# Patient Record
Sex: Male | Born: 1955
Health system: Southern US, Community
[De-identification: ages and names within clinical notes are randomized; demographics above are authoritative.]

## PROBLEM LIST (undated history)

## (undated) DIAGNOSIS — J309 Allergic rhinitis, unspecified: Secondary | ICD-10-CM

## (undated) DIAGNOSIS — M1990 Unspecified osteoarthritis, unspecified site: Secondary | ICD-10-CM

## (undated) DIAGNOSIS — E119 Type 2 diabetes mellitus without complications: Secondary | ICD-10-CM

## (undated) DIAGNOSIS — K219 Gastro-esophageal reflux disease without esophagitis: Secondary | ICD-10-CM

## (undated) DIAGNOSIS — G4730 Sleep apnea, unspecified: Secondary | ICD-10-CM

## (undated) DIAGNOSIS — I10 Essential (primary) hypertension: Secondary | ICD-10-CM

## (undated) DIAGNOSIS — F32A Depression, unspecified: Secondary | ICD-10-CM

## (undated) DIAGNOSIS — K649 Unspecified hemorrhoids: Secondary | ICD-10-CM

## (undated) DIAGNOSIS — E785 Hyperlipidemia, unspecified: Secondary | ICD-10-CM

## (undated) DIAGNOSIS — G4733 Obstructive sleep apnea (adult) (pediatric): Secondary | ICD-10-CM

## (undated) DIAGNOSIS — F329 Major depressive disorder, single episode, unspecified: Secondary | ICD-10-CM

## (undated) DIAGNOSIS — N200 Calculus of kidney: Secondary | ICD-10-CM

## (undated) DIAGNOSIS — T7840XA Allergy, unspecified, initial encounter: Secondary | ICD-10-CM

## (undated) HISTORY — DX: Allergic rhinitis, unspecified: J30.9

## (undated) HISTORY — DX: Unspecified hemorrhoids: K64.9

## (undated) HISTORY — DX: Obstructive sleep apnea (adult) (pediatric): G47.33

## (undated) HISTORY — DX: Calculus of kidney: N20.0

## (undated) HISTORY — DX: Allergy, unspecified, initial encounter: T78.40XA

## (undated) HISTORY — DX: Sleep apnea, unspecified: G47.30

## (undated) HISTORY — DX: Unspecified osteoarthritis, unspecified site: M19.90

## (undated) HISTORY — DX: Type 2 diabetes mellitus without complications: E11.9

## (undated) HISTORY — PX: COLONOSCOPY: SHX174

## (undated) HISTORY — DX: Depression, unspecified: F32.A

## (undated) HISTORY — DX: Gastro-esophageal reflux disease without esophagitis: K21.9

## (undated) HISTORY — DX: Major depressive disorder, single episode, unspecified: F32.9

---

## 2004-04-09 ENCOUNTER — Encounter: Admission: RE | Admit: 2004-04-09 | Discharge: 2004-04-09 | Payer: Self-pay | Admitting: Internal Medicine

## 2009-07-24 ENCOUNTER — Telehealth: Payer: Self-pay | Admitting: Internal Medicine

## 2010-08-26 NOTE — Telephone Encounter (Signed)
Summary: Schedule Colonoscopy  Phone Note Outgoing Call Call back at [facility] Phone [phone]   Call placed by: Harlow Mares CMA Duncan Dull),  Jul 24, 2009 2:04 PM Call placed to: Patient Summary of Call: Left message on patients machine to call back. patient is due for colonoscopy Initial call taken by: Harlow Mares CMA Duncan Dull),  Jul 24, 2009 2:04 PM  Follow-up for Phone Call        Left message on patients machine to call back. we will mail the pt a reminder letter to schedule his colonoscopy Follow-up by: Harlow Mares CMA Duncan Dull),  August 13, 2009 1:14 PM

## 2011-06-19 ENCOUNTER — Encounter: Payer: Self-pay | Admitting: Internal Medicine

## 2012-05-23 ENCOUNTER — Emergency Department (HOSPITAL_COMMUNITY): Payer: 59

## 2012-05-23 ENCOUNTER — Emergency Department (HOSPITAL_COMMUNITY)
Admission: EM | Admit: 2012-05-23 | Discharge: 2012-05-23 | Disposition: A | Discharge status: Home / Self Care | Payer: 59 | Attending: Emergency Medicine | Admitting: Emergency Medicine

## 2012-05-23 DIAGNOSIS — Z79899 Other long term (current) drug therapy: Secondary | ICD-10-CM | POA: Insufficient documentation

## 2012-05-23 DIAGNOSIS — Y9389 Activity, other specified: Secondary | ICD-10-CM | POA: Insufficient documentation

## 2012-05-23 DIAGNOSIS — Z7982 Long term (current) use of aspirin: Secondary | ICD-10-CM | POA: Insufficient documentation

## 2012-05-23 DIAGNOSIS — S32009A Unspecified fracture of unspecified lumbar vertebra, initial encounter for closed fracture: Secondary | ICD-10-CM | POA: Insufficient documentation

## 2012-05-23 DIAGNOSIS — S32010A Wedge compression fracture of first lumbar vertebra, initial encounter for closed fracture: Secondary | ICD-10-CM

## 2012-05-23 DIAGNOSIS — Y92410 Unspecified street and highway as the place of occurrence of the external cause: Secondary | ICD-10-CM | POA: Insufficient documentation

## 2012-05-23 MED ORDER — OXYCODONE-ACETAMINOPHEN 5-325 MG PO TABS: 1.0000 | ORAL_TABLET | Freq: Four times a day (QID) | ORAL | Status: DC | PRN

## 2012-05-23 MED ORDER — IBUPROFEN 400 MG PO TABS
600.0000 mg | ORAL_TABLET | Freq: Once | ORAL | Status: AC
  Administered 2012-05-23: 600 mg via ORAL
  Filled 2012-05-23: qty 1

## 2012-05-23 MED ORDER — OXYCODONE-ACETAMINOPHEN 5-325 MG PO TABS
1.0000 | ORAL_TABLET | Freq: Once | ORAL | Status: AC
  Administered 2012-05-23: 1 via ORAL
  Filled 2012-05-23: qty 1

## 2012-05-23 NOTE — ED Notes (Signed)
Ortho paged for lumbar corsett. Pt height 63ft 11.75in, weight 265lbs
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
## 2012-05-23 NOTE — Progress Notes (Signed)
Orthopedic Tech Progress Note Patient Details:  Joseph Wilkerson Apr 05, 1955 [id number] Called [employer] for brace order talked to [name]. Patient ID: Joseph Wilkerson, male   DOB: [birth year], 58 y.o.   MRN: [id number]   [name] 05/23/2012, 8:05 PM

## 2012-05-23 NOTE — ED Notes (Signed)
Per Ortho Tech, obtaining brace will take approx 1.5 hours (brace is coming from outside vendor). Pt aware of plan of care.
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
## 2012-05-23 NOTE — Progress Notes (Signed)
Orthopedic Tech Progress Note Patient Details:  [name] 12-20-1955 [id number] Brace order completed by [employer] vendor [name] at 8:00 pm. Patient ID: Joseph Wilkerson, male   DOB: 12-14-1955, 57 y.o.   MRN: [id number]   Joseph Wilkerson 05/23/2012, 8:49 PM
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
## 2012-06-03 ENCOUNTER — Other Ambulatory Visit (HOSPITAL_COMMUNITY): Payer: Self-pay | Admitting: Orthopaedic Surgery

## 2012-06-03 DIAGNOSIS — IMO0002 Reserved for concepts with insufficient information to code with codable children: Secondary | ICD-10-CM

## 2012-06-08 ENCOUNTER — Encounter (HOSPITAL_COMMUNITY): Payer: Self-pay

## 2012-06-08 ENCOUNTER — Ambulatory Visit (HOSPITAL_COMMUNITY)
Admission: RE | Admit: 2012-06-08 | Discharge: 2012-06-08 | Disposition: A | Discharge status: Home / Self Care | Payer: No Typology Code available for payment source | Source: Ambulatory Visit | Attending: Orthopaedic Surgery | Admitting: Orthopaedic Surgery

## 2012-06-08 DIAGNOSIS — IMO0002 Reserved for concepts with insufficient information to code with codable children: Secondary | ICD-10-CM

## 2012-06-08 HISTORY — DX: Essential (primary) hypertension: I10

## 2012-06-08 HISTORY — DX: Hyperlipidemia, unspecified: E78.5

## 2012-06-11 ENCOUNTER — Telehealth (HOSPITAL_COMMUNITY): Payer: Self-pay

## 2012-06-16 ENCOUNTER — Other Ambulatory Visit (HOSPITAL_COMMUNITY): Payer: Self-pay | Admitting: Interventional Radiology

## 2012-06-16 DIAGNOSIS — IMO0002 Reserved for concepts with insufficient information to code with codable children: Secondary | ICD-10-CM

## 2012-06-17 ENCOUNTER — Encounter (HOSPITAL_COMMUNITY): Payer: Self-pay | Admitting: Pharmacy Technician

## 2012-06-17 ENCOUNTER — Other Ambulatory Visit: Payer: Self-pay | Admitting: Radiology

## 2012-06-18 ENCOUNTER — Ambulatory Visit (HOSPITAL_COMMUNITY)
Admission: RE | Admit: 2012-06-18 | Discharge: 2012-06-18 | Disposition: A | Discharge status: Home / Self Care | Payer: 59 | Source: Ambulatory Visit | Attending: Interventional Radiology | Admitting: Interventional Radiology

## 2012-06-18 ENCOUNTER — Encounter (HOSPITAL_COMMUNITY): Payer: Self-pay

## 2012-06-18 DIAGNOSIS — I10 Essential (primary) hypertension: Secondary | ICD-10-CM | POA: Insufficient documentation

## 2012-06-18 DIAGNOSIS — E785 Hyperlipidemia, unspecified: Secondary | ICD-10-CM | POA: Insufficient documentation

## 2012-06-18 DIAGNOSIS — IMO0002 Reserved for concepts with insufficient information to code with codable children: Secondary | ICD-10-CM

## 2012-06-18 DIAGNOSIS — S32009A Unspecified fracture of unspecified lumbar vertebra, initial encounter for closed fracture: Secondary | ICD-10-CM | POA: Insufficient documentation

## 2012-06-18 HISTORY — PX: KYPHOPLASTY: SHX5884

## 2012-06-18 LAB — CBC
HCT: 45.9 % (ref 39.0–52.0)
Hemoglobin: 16 g/dL (ref 13.0–17.0)
MCH: 31.3 pg (ref 26.0–34.0)
MCHC: 34.9 g/dL (ref 30.0–36.0)
RBC: 5.11 MIL/uL (ref 4.22–5.81)

## 2012-06-18 LAB — BASIC METABOLIC PANEL
BUN: 12 mg/dL (ref 6–23)
CO2: 25 mEq/L (ref 19–32)
Chloride: 106 mEq/L (ref 96–112)
Glucose, Bld: 128 mg/dL — ABNORMAL HIGH (ref 70–99)
Potassium: 4.5 mEq/L (ref 3.5–5.1)
Sodium: 141 mEq/L (ref 135–145)

## 2012-06-18 MED ORDER — TOBRAMYCIN SULFATE 1.2 G IJ SOLR
INTRAMUSCULAR | Status: AC
  Filled 2012-06-18: qty 1.2

## 2012-06-18 MED ORDER — CEFAZOLIN SODIUM-DEXTROSE 2-3 GM-% IV SOLR
INTRAVENOUS | Status: AC
  Administered 2012-06-18: 2000 mg via INTRAVENOUS
  Filled 2012-06-18: qty 50

## 2012-06-18 MED ORDER — MIDAZOLAM HCL 2 MG/2ML IJ SOLN
INTRAMUSCULAR | Status: AC
  Filled 2012-06-18: qty 4

## 2012-06-18 MED ORDER — HYDROCODONE-ACETAMINOPHEN 5-325 MG PO TABS: 1.0000 | ORAL_TABLET | Freq: Once | ORAL | Status: DC

## 2012-06-18 MED ORDER — SODIUM CHLORIDE 0.9 % IV SOLN: INTRAVENOUS | Status: AC

## 2012-06-18 MED ORDER — FENTANYL CITRATE 0.05 MG/ML IJ SOLN
INTRAMUSCULAR | Status: AC
  Filled 2012-06-18: qty 4

## 2012-06-18 MED ORDER — HYDROCODONE-ACETAMINOPHEN 10-325 MG PO TABS: 1.0000 | ORAL_TABLET | Freq: Once | ORAL | Status: DC

## 2012-06-18 MED ORDER — FENTANYL CITRATE 0.05 MG/ML IJ SOLN
INTRAMUSCULAR | Status: DC | PRN
  Administered 2012-06-18 (×2): 12.5 ug via INTRAVENOUS
  Administered 2012-06-18 (×2): 25 ug via INTRAVENOUS
  Administered 2012-06-18: 12.5 ug via INTRAVENOUS

## 2012-06-18 MED ORDER — HYDROMORPHONE HCL PF 1 MG/ML IJ SOLN
INTRAMUSCULAR | Status: DC | PRN
  Administered 2012-06-18: 1 mg

## 2012-06-18 MED ORDER — HYDROMORPHONE HCL PF 1 MG/ML IJ SOLN
INTRAMUSCULAR | Status: AC
  Filled 2012-06-18: qty 1

## 2012-06-18 MED ORDER — MIDAZOLAM HCL 2 MG/2ML IJ SOLN
INTRAMUSCULAR | Status: AC
  Filled 2012-06-18: qty 2

## 2012-06-18 MED ORDER — CEFAZOLIN SODIUM-DEXTROSE 2-3 GM-% IV SOLR: 2.0000 g | Freq: Once | INTRAVENOUS | Status: DC

## 2012-06-18 MED ORDER — MIDAZOLAM HCL 2 MG/2ML IJ SOLN
INTRAMUSCULAR | Status: DC | PRN
  Administered 2012-06-18: 0.5 mg via INTRAVENOUS
  Administered 2012-06-18: 1 mg via INTRAVENOUS
  Administered 2012-06-18: 0.5 mg via INTRAVENOUS
  Administered 2012-06-18: 1 mg via INTRAVENOUS
  Administered 2012-06-18: 0.5 mg via INTRAVENOUS

## 2012-06-18 MED ORDER — HYDROCODONE-ACETAMINOPHEN 5-325 MG PO TABS
ORAL_TABLET | ORAL | Status: AC
  Administered 2012-06-18: 1 via OROMUCOSAL
  Filled 2012-06-18: qty 1

## 2012-06-18 MED ORDER — SODIUM CHLORIDE 0.9 % IV SOLN
Freq: Once | INTRAVENOUS | Status: AC
  Administered 2012-06-18: 07:00:00 via INTRAVENOUS
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
## 2012-06-18 NOTE — Procedures (Signed)
S/P L1KP with biopsy 
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
## 2012-06-21 ENCOUNTER — Encounter (HOSPITAL_COMMUNITY): Payer: Self-pay

## 2012-06-29 ENCOUNTER — Other Ambulatory Visit (HOSPITAL_COMMUNITY): Payer: Self-pay | Admitting: Interventional Radiology

## 2012-06-29 DIAGNOSIS — IMO0002 Reserved for concepts with insufficient information to code with codable children: Secondary | ICD-10-CM

## 2012-07-05 ENCOUNTER — Ambulatory Visit (HOSPITAL_COMMUNITY): Admission: RE | Admit: 2012-07-05 | Payer: No Typology Code available for payment source | Source: Ambulatory Visit

## 2012-08-02 ENCOUNTER — Encounter: Payer: Self-pay | Admitting: Internal Medicine

## 2012-08-04 LAB — IFOBT (OCCULT BLOOD): IFOBT: NEGATIVE

## 2012-08-18 ENCOUNTER — Ambulatory Visit: Payer: 59 | Admitting: Internal Medicine

## 2012-10-26 ENCOUNTER — Encounter: Payer: Self-pay | Admitting: *Deleted

## 2013-08-05 ENCOUNTER — Encounter: Payer: Self-pay | Admitting: Internal Medicine

## 2013-08-29 ENCOUNTER — Other Ambulatory Visit: Payer: Self-pay | Admitting: Dermatology

## 2013-09-16 ENCOUNTER — Ambulatory Visit (AMBULATORY_SURGERY_CENTER): Payer: Self-pay

## 2013-09-16 VITALS — Ht 70.0 in | Wt 261.0 lb

## 2013-09-16 DIAGNOSIS — Z860100 Personal history of colon polyps, unspecified: Secondary | ICD-10-CM

## 2013-09-16 MED ORDER — MOVIPREP 100 G PO SOLR: 1.0000 | Freq: Once | ORAL | Status: DC

## 2013-09-16 NOTE — Progress Notes (Signed)
No allergies to eggs or soy No past problems with anesthesia No home oxygen/ CPAP only No diet/weight loss meds  Has email  Emmi instructions given for colonoscopy

## 2013-09-30 ENCOUNTER — Encounter: Payer: Self-pay | Admitting: Internal Medicine

## 2013-10-07 ENCOUNTER — Ambulatory Visit (AMBULATORY_SURGERY_CENTER): Payer: 59 | Admitting: Internal Medicine

## 2013-10-07 ENCOUNTER — Encounter: Payer: Self-pay | Admitting: Internal Medicine

## 2013-10-07 VITALS — BP 137/65 | HR 60 | Temp 96.0°F | Resp 23 | Ht 70.0 in | Wt 261.0 lb

## 2013-10-07 DIAGNOSIS — D126 Benign neoplasm of colon, unspecified: Secondary | ICD-10-CM

## 2013-10-07 DIAGNOSIS — Z86010 Personal history of colonic polyps: Secondary | ICD-10-CM

## 2013-10-07 DIAGNOSIS — Z1211 Encounter for screening for malignant neoplasm of colon: Secondary | ICD-10-CM

## 2013-10-07 MED ORDER — SODIUM CHLORIDE 0.9 % IV SOLN: 500.0000 mL | INTRAVENOUS | Status: DC

## 2013-10-07 NOTE — Progress Notes (Signed)
Called to room to assist during endoscopic procedure.  Patient ID and intended procedure confirmed with present staff. Received instructions for my participation in the procedure from the performing physician.  

## 2013-10-07 NOTE — Progress Notes (Signed)
No complaints noted in the recovery room. Maw   

## 2013-10-07 NOTE — Patient Instructions (Signed)

## 2013-10-07 NOTE — Progress Notes (Signed)
Report to PACU, RN, vss, BBS= Clear.  

## 2013-10-07 NOTE — Op Note (Signed)
[place]  [address]. [place], [address]   COLONOSCOPY PROCEDURE REPORT  PATIENT: Joseph Wilkerson, Joseph Wilkerson  MR#: [id number] BIRTHDATE: [birth year] , 25  yrs. old GENDER: Male ENDOSCOPIST: Eustace Quail, MD REFERRED HD:[id number] Program Recall PROCEDURE DATE:  10/07/2013 PROCEDURE:   Colonoscopy with snare polypectomy x 3 First Screening Colonoscopy - Avg.  risk and is 50 yrs.  old or older - No.  Prior Negative Screening - Now for repeat screening. N/A  History of Adenoma - Now for follow-up colonoscopy & has been > or = to 3 yrs.  N/A  Polyps Removed Today? Yes. ASA CLASS:   Class II INDICATIONS:Patient's personal history of colon polyps. Index exam 2005. Tiny polyp without pathology. MEDICATIONS: MAC sedation, administered by CRNA and propofol (Diprivan) 300mg  IV  DESCRIPTION OF PROCEDURE:   After the risks benefits and alternatives of the procedure were thoroughly explained, informed consent was obtained.  A digital rectal exam revealed hemorrhoids. The LB [id number]  endoscope was introduced through the anus and advanced to the cecum, which was identified by both the appendix and ileocecal valve. No adverse events experienced.   The quality of the prep was excellent, using MoviPrep  The instrument was then slowly withdrawn as the colon was fully examined.  COLON FINDINGS: Three polyps ranging between 3-58mm in size were found in the ascending colon(2) and sigmoid colon.  A polypectomy was performed with a cold snare.  The resection was complete and the polyp tissue was completely retrieved.   Severe diverticulosis was noted in the left colon.   The colon mucosa was otherwise normal.  Retroflexed views revealed internal hemorrhoids. The time to cecum=2 minutes 26 seconds.  Withdrawal time=13 minutes 24 seconds.  The scope was withdrawn and the procedure completed. COMPLICATIONS: There were no complications.  ENDOSCOPIC IMPRESSION: 1.   Three  polyps  were found in the ascending colon and sigmoid colon; polypectomy was performed with a cold snare 2.   Severe diverticulosis was noted in the left colon 3.   The colon mucosa was otherwise normal  RECOMMENDATIONS: 1. Repeat colonoscopy in 5 years if polyp adenomatous; otherwise 10 years   eSigned:  Eustace Quail, MD 10/07/2013 9:36 AM   cc: Prince Solian, MD and The Patient

## 2013-10-10 ENCOUNTER — Telehealth: Payer: Self-pay | Admitting: *Deleted

## 2013-10-10 NOTE — Telephone Encounter (Signed)
  Follow up Call-  Call back number 10/07/2013  Post procedure Call Back phone  # [phone]  Permission to leave phone message Yes     Patient questions:  Do you have a fever, pain , or abdominal swelling? No. Pain Score  0 *  Have you tolerated food without any problems? Yes.    Have you been able to return to your normal activities? Yes.    Do you have any questions about your discharge instructions: Diet   No. Medications  No. Follow up visit  No.  Do you have questions or concerns about your Care? No.  Actions: * If pain score is 4 or above: No action needed, pain <4.

## 2013-10-18 ENCOUNTER — Encounter: Payer: Self-pay | Admitting: Internal Medicine
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
## 2016-03-14 DIAGNOSIS — E1165 Type 2 diabetes mellitus with hyperglycemia: Secondary | ICD-10-CM | POA: Diagnosis not present

## 2016-04-29 DIAGNOSIS — I10 Essential (primary) hypertension: Secondary | ICD-10-CM | POA: Diagnosis not present

## 2016-04-29 DIAGNOSIS — E1165 Type 2 diabetes mellitus with hyperglycemia: Secondary | ICD-10-CM | POA: Diagnosis not present

## 2016-07-08 DIAGNOSIS — E1165 Type 2 diabetes mellitus with hyperglycemia: Secondary | ICD-10-CM | POA: Diagnosis not present

## 2016-09-17 DIAGNOSIS — R350 Frequency of micturition: Secondary | ICD-10-CM | POA: Diagnosis not present

## 2016-09-17 DIAGNOSIS — N390 Urinary tract infection, site not specified: Secondary | ICD-10-CM | POA: Diagnosis not present

## 2016-10-07 DIAGNOSIS — E119 Type 2 diabetes mellitus without complications: Secondary | ICD-10-CM | POA: Diagnosis not present

## 2016-10-09 DIAGNOSIS — I10 Essential (primary) hypertension: Secondary | ICD-10-CM | POA: Diagnosis not present

## 2016-10-09 DIAGNOSIS — E119 Type 2 diabetes mellitus without complications: Secondary | ICD-10-CM | POA: Diagnosis not present

## 2016-11-20 DIAGNOSIS — R05 Cough: Secondary | ICD-10-CM | POA: Diagnosis not present

## 2016-11-20 DIAGNOSIS — N390 Urinary tract infection, site not specified: Secondary | ICD-10-CM | POA: Diagnosis not present

## 2016-11-20 DIAGNOSIS — R509 Fever, unspecified: Secondary | ICD-10-CM | POA: Diagnosis not present

## 2016-11-20 DIAGNOSIS — R8299 Other abnormal findings in urine: Secondary | ICD-10-CM | POA: Diagnosis not present

## 2016-12-10 DIAGNOSIS — Z0000 Encounter for general adult medical examination without abnormal findings: Secondary | ICD-10-CM | POA: Diagnosis not present

## 2016-12-17 DIAGNOSIS — Z23 Encounter for immunization: Secondary | ICD-10-CM | POA: Diagnosis not present

## 2016-12-17 DIAGNOSIS — E119 Type 2 diabetes mellitus without complications: Secondary | ICD-10-CM | POA: Diagnosis not present

## 2016-12-17 DIAGNOSIS — Z0000 Encounter for general adult medical examination without abnormal findings: Secondary | ICD-10-CM | POA: Diagnosis not present

## 2016-12-17 DIAGNOSIS — N390 Urinary tract infection, site not specified: Secondary | ICD-10-CM | POA: Diagnosis not present

## 2016-12-22 DIAGNOSIS — Z1212 Encounter for screening for malignant neoplasm of rectum: Secondary | ICD-10-CM | POA: Diagnosis not present

## 2017-04-21 DIAGNOSIS — N390 Urinary tract infection, site not specified: Secondary | ICD-10-CM | POA: Diagnosis not present

## 2017-04-21 DIAGNOSIS — E119 Type 2 diabetes mellitus without complications: Secondary | ICD-10-CM | POA: Diagnosis not present

## 2017-09-16 DIAGNOSIS — M25562 Pain in left knee: Secondary | ICD-10-CM | POA: Diagnosis not present

## 2017-09-16 DIAGNOSIS — E119 Type 2 diabetes mellitus without complications: Secondary | ICD-10-CM | POA: Diagnosis not present

## 2017-11-05 DIAGNOSIS — E119 Type 2 diabetes mellitus without complications: Secondary | ICD-10-CM | POA: Diagnosis not present

## 2017-11-23 DIAGNOSIS — J309 Allergic rhinitis, unspecified: Secondary | ICD-10-CM | POA: Diagnosis not present

## 2017-11-23 DIAGNOSIS — J209 Acute bronchitis, unspecified: Secondary | ICD-10-CM | POA: Diagnosis not present

## 2017-11-23 DIAGNOSIS — R05 Cough: Secondary | ICD-10-CM | POA: Diagnosis not present

## 2018-01-04 DIAGNOSIS — Z0000 Encounter for general adult medical examination without abnormal findings: Secondary | ICD-10-CM | POA: Diagnosis not present

## 2018-01-04 DIAGNOSIS — R82998 Other abnormal findings in urine: Secondary | ICD-10-CM | POA: Diagnosis not present

## 2018-01-08 DIAGNOSIS — Z1389 Encounter for screening for other disorder: Secondary | ICD-10-CM | POA: Diagnosis not present

## 2018-01-08 DIAGNOSIS — Z0000 Encounter for general adult medical examination without abnormal findings: Secondary | ICD-10-CM | POA: Diagnosis not present

## 2018-01-08 DIAGNOSIS — I10 Essential (primary) hypertension: Secondary | ICD-10-CM | POA: Diagnosis not present

## 2018-01-08 DIAGNOSIS — E119 Type 2 diabetes mellitus without complications: Secondary | ICD-10-CM | POA: Diagnosis not present

## 2018-01-15 DIAGNOSIS — Z1212 Encounter for screening for malignant neoplasm of rectum: Secondary | ICD-10-CM | POA: Diagnosis not present

## 2018-05-19 DIAGNOSIS — E119 Type 2 diabetes mellitus without complications: Secondary | ICD-10-CM | POA: Diagnosis not present

## 2018-05-19 DIAGNOSIS — I10 Essential (primary) hypertension: Secondary | ICD-10-CM | POA: Diagnosis not present

## 2018-10-04 ENCOUNTER — Encounter: Payer: Self-pay | Admitting: Internal Medicine

## 2018-10-11 ENCOUNTER — Encounter: Payer: Self-pay | Admitting: Internal Medicine

## 2018-10-29 ENCOUNTER — Telehealth: Payer: Self-pay | Admitting: *Deleted

## 2018-10-29 NOTE — Telephone Encounter (Signed)
Call placed and message left for pt. To contact our office to reschedule nurse visit by 5 p.m. or we would have to schedule procedure on 11/12/18.

## 2018-10-29 NOTE — Telephone Encounter (Signed)
Pt. Called back and rescheduled pre-visit for 11/04/18

## 2018-11-04 ENCOUNTER — Ambulatory Visit (AMBULATORY_SURGERY_CENTER): Payer: Self-pay | Admitting: *Deleted

## 2018-11-04 ENCOUNTER — Other Ambulatory Visit: Payer: Self-pay

## 2018-11-04 VITALS — Temp 97.3°F | Ht 71.0 in | Wt 234.8 lb

## 2018-11-04 DIAGNOSIS — Z86010 Personal history of colonic polyps: Secondary | ICD-10-CM

## 2018-11-04 MED ORDER — SUPREP BOWEL PREP KIT 17.5-3.13-1.6 GM/177ML PO SOLN: 1.0000 | Freq: Once | ORAL | 0 refills | Status: AC

## 2018-11-04 NOTE — Progress Notes (Signed)
No egg or soy allergy known to patient  No issues with past sedation with any surgeries  or procedures, no intubation problems  No diet pills per patient No home 02 use per patient  No blood thinners per patient  Pt rare issues with constipation  No A fib or A flutter  EMMI information given

## 2018-11-05 ENCOUNTER — Encounter: Payer: Self-pay | Admitting: Internal Medicine

## 2018-11-11 ENCOUNTER — Telehealth: Payer: Self-pay

## 2018-11-11 NOTE — Telephone Encounter (Signed)
Pt responded "no" to all screening questions °

## 2018-11-11 NOTE — Telephone Encounter (Signed)
Covid-19 screening questions   Do you now or have you had a fever in the last 14 days?  Do you have any respiratory symptoms of shortness of breath or cough now or in the last 14 days?  Do you have any family members or close contacts with diagnosed or suspected Covid-19 in the past 14 days?  Have you been tested for Covid-19 and found to be positive?       

## 2018-11-12 ENCOUNTER — Encounter: Payer: Self-pay | Admitting: Internal Medicine

## 2018-11-12 ENCOUNTER — Other Ambulatory Visit: Payer: Self-pay

## 2018-11-12 ENCOUNTER — Ambulatory Visit (AMBULATORY_SURGERY_CENTER): Payer: 59 | Admitting: Internal Medicine

## 2018-11-12 VITALS — BP 114/68 | HR 58 | Temp 97.8°F | Resp 13 | Ht 71.0 in | Wt 234.0 lb

## 2018-11-12 DIAGNOSIS — Z86010 Personal history of colonic polyps: Secondary | ICD-10-CM | POA: Diagnosis present

## 2018-11-12 DIAGNOSIS — K635 Polyp of colon: Secondary | ICD-10-CM

## 2018-11-12 DIAGNOSIS — D125 Benign neoplasm of sigmoid colon: Secondary | ICD-10-CM

## 2018-11-12 MED ORDER — SODIUM CHLORIDE 0.9 % IV SOLN: 500.0000 mL | Freq: Once | INTRAVENOUS | Status: DC

## 2018-11-12 NOTE — Progress Notes (Signed)
Called to room to assist during endoscopic procedure.  Patient ID and intended procedure confirmed with present staff. Received instructions for my participation in the procedure from the performing physician.  

## 2018-11-12 NOTE — Op Note (Signed)
[facility] Patient Name: Joseph Wilkerson Procedure Date: 11/12/2018 11:10 AM MRN: [id number] Endoscopist: Docia Chuck. Henrene Pastor , MD Age: 63 Referring MD:  Date of Birth: Feb 12, 1956 Gender: Male Account #: [id number] Procedure:                Colonoscopy cold snare polypectomy x 1 Indications:              High risk colon cancer surveillance: Personal                            history of multiple (3 or more) adenomas. Previous                            examinations 2005 and 2015 Medicines:                Monitored Anesthesia Care Procedure:                Pre-Anesthesia Assessment:                           - Prior to the procedure, a History and Physical                            was performed, and patient medications and                            allergies were reviewed. The patient's tolerance of                            previous anesthesia was also reviewed. The risks                            and benefits of the procedure and the sedation                            options and risks were discussed with the patient.                            All questions were answered, and informed consent                            was obtained. Prior Anticoagulants: The patient has                            taken no previous anticoagulant or antiplatelet                            agents. ASA Grade Assessment: II - A patient with                            mild systemic disease. After reviewing the risks                            and benefits, the patient was deemed in  satisfactory condition to undergo the procedure.                           After obtaining informed consent, the colonoscope                            was passed under direct vision. Throughout the                            procedure, the patient's blood pressure, pulse, and                            oxygen saturations were monitored continuously. The   Colonoscope was introduced through the anus and                            advanced to the the cecum, identified by                            appendiceal orifice and ileocecal valve. The                            ileocecal valve, appendiceal orifice, and rectum                            were photographed. The quality of the bowel                            preparation was excellent. The colonoscopy was                            performed without difficulty. The patient tolerated                            the procedure well. The bowel preparation used was                            SUPREP via split dose instruction. Scope In: 11:23:00 AM Scope Out: 11:38:46 AM Scope Withdrawal Time: 0 hours 13 minutes 15 seconds  Total Procedure Duration: 0 hours 15 minutes 46 seconds  Findings:                 A 6 mm polyp was found in the sigmoid colon. The                            polyp was removed with a cold snare. Resection and                            retrieval were complete.                           Multiple small and large-mouthed diverticula were                            found in the left  colon and right colon.                           External and internal hemorrhoids were found during                            retroflexion. The hemorrhoids were moderate.                           The exam was otherwise without abnormality on                            direct and retroflexion views. Complications:            No immediate complications. Estimated blood loss:                            None. Estimated Blood Loss:     Estimated blood loss: none. Impression:               - One 6 mm polyp in the sigmoid colon, removed with                            a cold snare. Resected and retrieved.                           - Diverticulosis in the left colon and in the right                            colon.                           - External and internal hemorrhoids.                            - The examination was otherwise normal on direct                            and retroflexion views. Recommendation:           - Repeat colonoscopy in 5 years for surveillance.                           - Patient has a contact number available for                            emergencies. The signs and symptoms of potential                            delayed complications were discussed with the                            patient. Return to normal activities tomorrow.                            Written discharge instructions were provided to the  patient.                           - Resume previous diet.                           - Continue present medications.                           - Await pathology results.                           - Please schedule an appointment with Dr. Carlean Purl                            for an office hemorrhoidal banding for his                            symptomatic internal hemorrhoids John N. Henrene Pastor, MD 11/12/2018 11:51:20 AM This report has been signed electronically.

## 2018-11-12 NOTE — Progress Notes (Signed)
Pt's states no medical or surgical changes since previsit or office visit.  Joseph Wilkerson-temp Courtney Washington - vitals 

## 2018-11-12 NOTE — Patient Instructions (Signed)
Handout on polyps, hemorrhoids and banding given. Please call DR. Carlean Purl to schedule hemorrhoid banding [phone].   YOU HAD AN ENDOSCOPIC PROCEDURE TODAY AT [facility]:   Refer to the procedure report that was given to you for any specific questions about what was found during the examination.  If the procedure report does not answer your questions, please call your gastroenterologist to clarify.  If you requested that your care partner not be given the details of your procedure findings, then the procedure report has been included in a sealed envelope for you to review at your convenience later.  YOU SHOULD EXPECT: Some feelings of bloating in the abdomen. Passage of more gas than usual.  Walking can help get rid of the air that was put into your GI tract during the procedure and reduce the bloating. If you had a lower endoscopy (such as a colonoscopy or flexible sigmoidoscopy) you may notice spotting of blood in your stool or on the toilet paper. If you underwent a bowel prep for your procedure, you may not have a normal bowel movement for a few days.  Please Note:  You might notice some irritation and congestion in your nose or some drainage.  This is from the oxygen used during your procedure.  There is no need for concern and it should clear up in a day or so.  SYMPTOMS TO REPORT IMMEDIATELY:   Following lower endoscopy (colonoscopy or flexible sigmoidoscopy):  Excessive amounts of blood in the stool  Significant tenderness or worsening of abdominal pains  Swelling of the abdomen that is new, acute  Fever of 100F or higher   For urgent or emergent issues, a gastroenterologist can be reached at any hour by calling [phone].   DIET:  We do recommend a small meal at first, but then you may proceed to your regular diet.  Drink plenty of fluids but you should avoid alcoholic beverages for 24 hours.  ACTIVITY:  You should plan to take it easy for the rest of today  and you should NOT DRIVE or use heavy machinery until tomorrow (because of the sedation medicines used during the test).    FOLLOW UP: Our staff will call the number listed on your records 48-72 hours following your procedure to check on you and address any questions or concerns that you may have regarding the information given to you following your procedure. If we do not reach you, we will leave a message.  We will attempt to reach you two times.  During this call, we will ask if you have developed any symptoms of COVID 19. If you develop any symptoms (ie: fever, flu-like symptoms, shortness of breath, cough etc.) before then, please call [phone].  If you test positive for Covid 19 in the 2 weeks post procedure, please call and report this information to [place].    If any biopsies were taken you will be contacted by phone or by letter within the next 1-3 weeks.  Please call us at [phone] if you have not heard about the biopsies in 3 weeks.    SIGNATURES/CONFIDENTIALITY: You and/or your care partner have signed paperwork which will be entered into your electronic medical record.  These signatures attest to the fact that that the information above on your After Visit Summary has been reviewed and is understood.  Full responsibility of the confidentiality of this discharge information lies with you and/or your care-partner.

## 2018-11-12 NOTE — Progress Notes (Signed)
PT taken to PACU. Monitors in place. VSS. Report given to RN. 

## 2018-11-16 ENCOUNTER — Telehealth: Payer: Self-pay | Admitting: *Deleted

## 2018-11-16 ENCOUNTER — Telehealth: Payer: Self-pay

## 2018-11-16 NOTE — Telephone Encounter (Signed)
No answer for post procedure call back. Left message and will call back later this afternoon. SM

## 2018-11-16 NOTE — Telephone Encounter (Signed)
  Follow up Call-  Call back number 11/12/2018  Post procedure Call Back phone  # [phone]  Permission to leave phone message Yes  Some recent data might be hidden     Patient questions:  Do you have a fever, pain , or abdominal swelling? No. Pain Score  0 *  Have you tolerated food without any problems? Yes.    Have you been able to return to your normal activities? Yes.    Do you have any questions about your discharge instructions: Diet   No. Medications  No. Follow up visit  No.  Do you have questions or concerns about your Care? No.  Actions: * If pain score is 4 or above: No action needed, pain <4.  1. Have you developed a fever since your procedure? no  2.   Have you had an respiratory symptoms (SOB or cough) since your procedure? no  3.   Have you tested positive for COVID 19 since your procedure no  4.   Have you had any family members/close contacts diagnosed with the COVID 19 since your procedure?  no   If yes to any of these questions please route to Joylene John, RN and Alphonsa Gin, Therapist, sports.

## 2018-11-17 ENCOUNTER — Encounter: Payer: Self-pay | Admitting: Internal Medicine

## 2018-12-08 ENCOUNTER — Encounter: Payer: 59 | Admitting: Internal Medicine

## 2018-12-30 ENCOUNTER — Encounter: Payer: 59 | Admitting: Internal Medicine

## 2020-10-09 ENCOUNTER — Other Ambulatory Visit: Payer: Self-pay

## 2020-10-09 ENCOUNTER — Inpatient Hospital Stay (HOSPITAL_COMMUNITY)
Admission: EM | Admit: 2020-10-09 | Discharge: 2020-10-12 | DRG: 246 | Disposition: A | Discharge status: Home / Self Care | Payer: 59 | Attending: Cardiovascular Disease | Admitting: Cardiovascular Disease

## 2020-10-09 ENCOUNTER — Emergency Department (HOSPITAL_COMMUNITY): Payer: 59

## 2020-10-09 DIAGNOSIS — K219 Gastro-esophageal reflux disease without esophagitis: Secondary | ICD-10-CM | POA: Diagnosis present

## 2020-10-09 DIAGNOSIS — E7800 Pure hypercholesterolemia, unspecified: Secondary | ICD-10-CM | POA: Diagnosis present

## 2020-10-09 DIAGNOSIS — E119 Type 2 diabetes mellitus without complications: Secondary | ICD-10-CM | POA: Diagnosis present

## 2020-10-09 DIAGNOSIS — I2119 ST elevation (STEMI) myocardial infarction involving other coronary artery of inferior wall: Secondary | ICD-10-CM | POA: Diagnosis not present

## 2020-10-09 DIAGNOSIS — Z20822 Contact with and (suspected) exposure to covid-19: Secondary | ICD-10-CM | POA: Diagnosis present

## 2020-10-09 DIAGNOSIS — I472 Ventricular tachycardia: Secondary | ICD-10-CM | POA: Diagnosis present

## 2020-10-09 DIAGNOSIS — Z833 Family history of diabetes mellitus: Secondary | ICD-10-CM

## 2020-10-09 DIAGNOSIS — I2510 Atherosclerotic heart disease of native coronary artery without angina pectoris: Secondary | ICD-10-CM | POA: Diagnosis present

## 2020-10-09 DIAGNOSIS — R079 Chest pain, unspecified: Secondary | ICD-10-CM | POA: Diagnosis not present

## 2020-10-09 DIAGNOSIS — I213 ST elevation (STEMI) myocardial infarction of unspecified site: Secondary | ICD-10-CM

## 2020-10-09 DIAGNOSIS — Z8249 Family history of ischemic heart disease and other diseases of the circulatory system: Secondary | ICD-10-CM

## 2020-10-09 DIAGNOSIS — Z955 Presence of coronary angioplasty implant and graft: Secondary | ICD-10-CM

## 2020-10-09 DIAGNOSIS — I110 Hypertensive heart disease with heart failure: Secondary | ICD-10-CM | POA: Diagnosis present

## 2020-10-09 DIAGNOSIS — Z7984 Long term (current) use of oral hypoglycemic drugs: Secondary | ICD-10-CM

## 2020-10-09 DIAGNOSIS — F17210 Nicotine dependence, cigarettes, uncomplicated: Secondary | ICD-10-CM | POA: Diagnosis present

## 2020-10-09 DIAGNOSIS — I440 Atrioventricular block, first degree: Secondary | ICD-10-CM | POA: Diagnosis present

## 2020-10-09 DIAGNOSIS — Z7982 Long term (current) use of aspirin: Secondary | ICD-10-CM

## 2020-10-09 DIAGNOSIS — Z79899 Other long term (current) drug therapy: Secondary | ICD-10-CM

## 2020-10-09 DIAGNOSIS — I5031 Acute diastolic (congestive) heart failure: Secondary | ICD-10-CM | POA: Diagnosis present

## 2020-10-09 MED ORDER — HEPARIN SODIUM (PORCINE) 5000 UNIT/ML IJ SOLN
INTRAMUSCULAR | Status: AC
  Filled 2020-10-09: qty 1

## 2020-10-09 MED ORDER — SODIUM CHLORIDE 0.9 % IV SOLN: INTRAVENOUS | Status: DC

## 2020-10-09 NOTE — ED Triage Notes (Addendum)
Pt c/o CP/ tightness x3hrs, began while resting in bed. Endorses tingling in L arm, clammy, sweaty, felt like "ball in the chest" & "earache." Pt states hx DM, HTN, compliant w medications. Pt took full dose of ASA PTA

## 2020-10-09 NOTE — H&P (Addendum)
Cardiology Admission History and Physical:   Patient ID: Joseph Wilkerson MRN: [id number]; DOB: 01-29-1956   Admission date: 10/09/2020  PCP:  Prince Solian, MD   [facility] Providers Cardiologist:  None   {   Chief Complaint:  chest pain    Patient Profile:   Joseph Wilkerson is a 65 y.o. male with a PMHx of HTN, HLD, T2DM and daily smoker who is being seen 10/10/2020 for the evaluation of chest pain.  History of Present Illness:   Joseph Wilkerson reports sudden onset mid-chest tightness (3/10 on severity) approximately 9pm tonight while watching TV. The pain radiates to his left arm and associated with mild dyspnea, clammy, sweating, and lightheadedness. He says he had a long-day work at his job earlier. There is no alleviating or precipitating factors he can recall. Denies fever, chills, syncope, cough, heart palpitations, nausea, vomiting, abdominal fullness, dysuria, diarrhea, pedal edema or any bleeding events.    Reports history of daily smoker (1 pack per day). Denies Fhx of early coronary artery disease.  LABS pending  Past Medical History:  Diagnosis Date   Allergic rhinitis    Allergy    Arthritis    Depression    Diabetes (HCC)    GERD (gastroesophageal reflux disease)    Hemorrhoids    HTN (hypertension)    Hyperlipidemia    Nephrolithiasis    OSA (obstructive sleep apnea)    Sleep apnea     Past Surgical History:  Procedure Laterality Date   COLONOSCOPY     KYPHOPLASTY  06/18/2012   sec L Comp Fx     Medications Prior to Admission: Prior to Admission medications   Medication Sig Start Date End Date Taking? Authorizing Provider  aspirin EC 81 MG tablet Take 324 mg by mouth daily.     [provider]  fluticasone (FLONASE) 50 MCG/ACT nasal spray USE 2 SPRAY(S) IN EACH NOSTRIL ONCE DAILY 05/30/18   [provider]  ibuprofen (ADVIL,MOTRIN) 200 MG tablet Take 400 mg by mouth 3 (three) times daily as needed for pain.    [provider]  irbesartan (AVAPRO) 300 MG tablet Take 300 mg by mouth daily. 10/03/18   [provider]  metFORMIN (GLUCOPHAGE) 500 MG tablet TAKE 2 TABLETS BY MOUTH TWICE DAILY WITH BREAKFAST AND DINNER 07/08/18   [provider]  ONETOUCH VERIO test strip USE 1 STRIP TO [place] DAILY 10/25/18   [provider]  pravastatin (PRAVACHOL) 40 MG tablet Take 40 mg by mouth daily.    [provider]  PRESCRIPTION MEDICATION Acetonide cream for poison ivy    [provider]  STEGLATRO 5 MG TABS Take 1 tablet by mouth daily. 10/21/18   [provider]     Allergies:   No Known Allergies  Social History:   Social History   Socioeconomic History   Marital status: Single    Spouse name: Not on file   Number of children: 3   Years of education: Not on file   Highest education level: Not on file  Occupational History   Occupation: Hospitality    Employer: [employer]  Tobacco Use   Smoking status: Every Day    Packs/day: 1.00    Types: Cigarettes   Smokeless tobacco: Never  Vaping Use   Vaping Use: Never used  Substance and Sexual Activity   Alcohol use: Yes    Comment: Socillay-onc per month   Drug use: No   Sexual activity: Not on  file  Other Topics Concern   Not on file  Social History Narrative   Not on file   Social Determinants of Health   Financial Resource Strain: Not on file  Food Insecurity: Not on file  Transportation Needs: Not on file  Physical Activity: Not on file  Stress: Not on file  Social Connections: Not on file  Intimate Partner Violence: Not on file    Family History:   The patient's family history includes CAD in his brother and father; Cancer in his mother; Diabetes in his brother; Heart attack in his father. There is no history of Colon cancer, Pancreatic cancer, Rectal cancer, Stomach cancer, or Esophageal cancer.    ROS:  Please see the history of present illness.  All other ROS reviewed  and negative.     Physical Exam/Data:   Vitals:   10/09/20 2321 10/09/20 2345 10/10/20 0000  BP: (!) 161/95 (!) 172/95 (!) 148/85  Pulse: 74 78 74  Resp: '18 15 19  '$ Temp: 98 F (36.7 C)    TempSrc: Oral    SpO2: 98% 98% 96%   No intake or output data in the 24 hours ending 10/10/20 0004 Last 3 Weights 11/12/2018 11/04/2018 10/07/2013  Weight (lbs) 234 lb 234 lb 12.8 oz 261 lb  Weight (kg) 106.142 kg 106.505 kg 118.389 kg     There is no height or weight on file to calculate BMI.  General:  Well nourished, well developed, in no acute distress HEENT: normal Lymph: no adenopathy Neck: no JVD Endocrine:  No thryomegaly Vascular: No carotid bruits; FA pulses 2+ bilaterally without bruits  Cardiac:  normal S1, S2; RRR; no murmur  Lungs:  clear to auscultation bilaterally, no wheezing, rhonchi or rales  Abd: soft, nontender, no hepatomegaly  Ext: no edema Musculoskeletal:  No deformities, BUE and BLE strength normal and equal Skin: warm and dry  Neuro:  CNs 2-12 intact, no focal abnormalities noted Psych:  Normal affect    EKG:  The ECG that was done  was personally reviewed and demonstrates SR, STE inferior leads with reciprocal changes lateral leads  Relevant CV Studies:   Laboratory Data:  High Sensitivity Troponin:  No results for input(s): TROPONINIHS in the last 720 hours.    ChemistryNo results for input(s): NA, K, CL, CO2, GLUCOSE, BUN, CREATININE, CALCIUM, GFRNONAA, GFRAA, ANIONGAP in the last 168 hours.  No results for input(s): PROT, ALBUMIN, AST, ALT, ALKPHOS, BILITOT in the last 168 hours. HematologyNo results for input(s): WBC, RBC, HGB, HCT, MCV, MCH, MCHC, RDW, PLT in the last 168 hours. BNPNo results for input(s): BNP, PROBNP in the last 168 hours.  DDimer No results for input(s): DDIMER in the last 168 hours.   Radiology/Studies:  DG Chest Port 1 View  Result Date: 10/09/2020 CLINICAL DATA:  Sudden onset of chest pain. EXAM: PORTABLE CHEST 1 VIEW COMPARISON:   None. FINDINGS: Mild cardiomegaly. There is aortic tortuosity. Mild peribronchial thickening. No focal airspace disease. No pleural effusion or pneumothorax. No acute osseous abnormalities are seen. IMPRESSION: 1. Mild cardiomegaly and aortic tortuosity. 2. Mild peribronchial thickening. Electronically Signed   By: Keith Rake M.D.   On: 10/09/2020 23:47     Assessment and Plan:   STEMI--appears inferior STEMI -continue Telemetry -EKR prn -continue ASA and high-intensity of statin -start beta-blocker if there is no contraindication during left heart catheterization -consider continue his home ARB (irbesartan) if there is no contraindications. -risk stratification with A1C, TSH and lipid panel -acquire a TTE  am -emergent left heart catheterization    Risk Assessment/Risk Scores:    TIMI Risk Score for ST  Elevation MI:   The patient's TIMI risk score is 2, which indicates a 2.2% risk of all cause mortality at 30 days.      Severity of Illness: The appropriate patient status for this patient is INPATIENT. Inpatient status is judged to be reasonable and necessary in order to provide the required intensity of service to ensure the patient's safety. The patient's presenting symptoms, physical exam findings, and initial radiographic and laboratory data in the context of their chronic comorbidities is felt to place them at high risk for further clinical deterioration. Furthermore, it is not anticipated that the patient will be medically stable for discharge from the hospital within 2 midnights of admission. The following factors support the patient status of inpatient.   " The patient's presenting symptoms include chest pain. " The worrisome physical exam findings include NA. " The initial radiographic and laboratory data are worrisome because of ST elevation on EKG. " The chronic co-morbidities include HTN, HLD, T2DM, smoker.   * I certify that at the point of admission it is my  clinical judgment that the patient will require inpatient hospital care spanning beyond 2 midnights from the point of admission due to high intensity of service, high risk for further deterioration and high frequency of surveillance required.*   For questions or updates, please contact [place] Please consult [email or web address] for contact info under     Signed, Laurice Record, MD  10/10/2020 12:04 AM

## 2020-10-09 NOTE — ED Provider Notes (Signed)
Emergency Medicine Provider Triage Evaluation Note  Joseph Wilkerson , a 65 y.o. male  was evaluated in triage.  Pt complains of pain.  The patient reports sudden onset chest pain, characterized as a ball of tightness, in the center of his chest that began at approximately 830 or 9 PM accompanied by pain in his left arm, left side of his neck, sweating, and feeling clammy.  Symptoms began while he was sitting down watching TV.  He reports having worked a 12 to 13-hour day at his job earlier.  He states that he was feeling well and attempted to lay down and sleep, but the symptoms persisted and he was unable to get any rest.  States this laying on his left side he initially had a headache and some dizziness so he took a tablet of meclizine.  Dizziness and headache have since resolved.  Chest pain and left arm pain have improved since onset, but has not resolved.  She has been fully vaccinated against COVID-19.  Infectious symptoms  Patient's father passed away from an MI, in his mid 65s.  Patient has a history of hypertension, hyperlipidemia, tobacco use, and diabetes mellitus.  Review of Systems  Positive: Chest pain, arm pain, neck pain, dizziness, diaphoresis,  Negative: Vomiting, fever, chills, leg swelling, syncope  Physical Exam  BP (!) 161/95 (BP Location: Left Arm)   Pulse 74   Temp 98 F (36.7 C) (Oral)   Resp 18   SpO2 98%  Gen:   Awake, no distress   Resp:  Normal effort  MSK:   Moves extremities without difficulty  Other:  No diaphoresis at this time.  No tachycardia.  Pallor.  Medical Decision Making  Medically screening exam initiated at 11:34 PM.  Appropriate orders placed.  [name] was informed that the remainder of the evaluation will be completed by another provider, this initial triage assessment does not replace that evaluation, and the importance of remaining in the ED until their evaluation is complete.  65 year old male presenting with chest pain, left arm  pain, neck pain, diaphoresis, clamminess, dizziness, and headache.  Family history of MI in his father at the age of 39.  He has multiple risk factors for CAD, but has never been diagnosed.  EKG concerning for STEMI.  Labs have been ordered.  Patient remained in triage area until EKG evaluated by physician.  Code STEMI has been called by physician and patient will be transferred ported immediately back to an acute room for further work-up and evaluation.   Joline Maxcy A, PA-C 10/09/20 2335    Orpah Greek, MD 10/10/20 [phone]

## 2020-10-09 NOTE — ED Notes (Signed)
4000units heparin IV given.

## 2020-10-10 ENCOUNTER — Other Ambulatory Visit (HOSPITAL_COMMUNITY): Payer: Self-pay

## 2020-10-10 ENCOUNTER — Other Ambulatory Visit: Payer: Self-pay

## 2020-10-10 ENCOUNTER — Inpatient Hospital Stay (HOSPITAL_COMMUNITY)
Admission: EM | Disposition: A | Discharge status: Home / Self Care | Payer: Self-pay | Source: Home / Self Care | Attending: Cardiovascular Disease

## 2020-10-10 ENCOUNTER — Inpatient Hospital Stay (HOSPITAL_COMMUNITY): Payer: 59

## 2020-10-10 ENCOUNTER — Encounter (HOSPITAL_COMMUNITY): Payer: Self-pay | Admitting: Cardiovascular Disease

## 2020-10-10 DIAGNOSIS — I2510 Atherosclerotic heart disease of native coronary artery without angina pectoris: Secondary | ICD-10-CM

## 2020-10-10 DIAGNOSIS — I110 Hypertensive heart disease with heart failure: Secondary | ICD-10-CM | POA: Diagnosis present

## 2020-10-10 DIAGNOSIS — I440 Atrioventricular block, first degree: Secondary | ICD-10-CM | POA: Diagnosis present

## 2020-10-10 DIAGNOSIS — I5031 Acute diastolic (congestive) heart failure: Secondary | ICD-10-CM

## 2020-10-10 DIAGNOSIS — Z8249 Family history of ischemic heart disease and other diseases of the circulatory system: Secondary | ICD-10-CM | POA: Diagnosis not present

## 2020-10-10 DIAGNOSIS — Z20822 Contact with and (suspected) exposure to covid-19: Secondary | ICD-10-CM | POA: Diagnosis present

## 2020-10-10 DIAGNOSIS — I2119 ST elevation (STEMI) myocardial infarction involving other coronary artery of inferior wall: Secondary | ICD-10-CM | POA: Diagnosis present

## 2020-10-10 DIAGNOSIS — F17210 Nicotine dependence, cigarettes, uncomplicated: Secondary | ICD-10-CM | POA: Diagnosis present

## 2020-10-10 DIAGNOSIS — E7800 Pure hypercholesterolemia, unspecified: Secondary | ICD-10-CM | POA: Diagnosis present

## 2020-10-10 DIAGNOSIS — E119 Type 2 diabetes mellitus without complications: Secondary | ICD-10-CM | POA: Diagnosis present

## 2020-10-10 DIAGNOSIS — Z7982 Long term (current) use of aspirin: Secondary | ICD-10-CM | POA: Diagnosis not present

## 2020-10-10 DIAGNOSIS — I472 Ventricular tachycardia: Secondary | ICD-10-CM

## 2020-10-10 DIAGNOSIS — Z7984 Long term (current) use of oral hypoglycemic drugs: Secondary | ICD-10-CM | POA: Diagnosis not present

## 2020-10-10 DIAGNOSIS — I2111 ST elevation (STEMI) myocardial infarction involving right coronary artery: Secondary | ICD-10-CM | POA: Diagnosis not present

## 2020-10-10 DIAGNOSIS — I10 Essential (primary) hypertension: Secondary | ICD-10-CM | POA: Diagnosis not present

## 2020-10-10 DIAGNOSIS — K219 Gastro-esophageal reflux disease without esophagitis: Secondary | ICD-10-CM | POA: Diagnosis present

## 2020-10-10 DIAGNOSIS — R079 Chest pain, unspecified: Secondary | ICD-10-CM | POA: Diagnosis present

## 2020-10-10 DIAGNOSIS — Z79899 Other long term (current) drug therapy: Secondary | ICD-10-CM | POA: Diagnosis not present

## 2020-10-10 DIAGNOSIS — Z833 Family history of diabetes mellitus: Secondary | ICD-10-CM | POA: Diagnosis not present

## 2020-10-10 HISTORY — PX: LEFT HEART CATH AND CORONARY ANGIOGRAPHY: CATH118249

## 2020-10-10 HISTORY — PX: CORONARY/GRAFT ACUTE MI REVASCULARIZATION: CATH118305

## 2020-10-10 LAB — BASIC METABOLIC PANEL
Anion gap: 13 (ref 5–15)
Anion gap: 10 (ref 5–15)
BUN: 15 mg/dL (ref 8–23)
BUN: 13 mg/dL (ref 8–23)
CO2: 23 mmol/L (ref 22–32)
CO2: 21 mmol/L — ABNORMAL LOW (ref 22–32)
Calcium: 9.9 mg/dL (ref 8.9–10.3)
Calcium: 9 mg/dL (ref 8.9–10.3)
Chloride: 105 mmol/L (ref 98–111)
Chloride: 108 mmol/L (ref 98–111)
Creatinine, Ser: 1.11 mg/dL (ref 0.61–1.24)
Creatinine, Ser: 0.94 mg/dL (ref 0.61–1.24)
GFR, Estimated: >60 mL/min (ref >60)
GFR, Estimated: >60 mL/min (ref >60)
Glucose, Bld: 150 mg/dL — ABNORMAL HIGH (ref 70–99)
Glucose, Bld: 117 mg/dL — ABNORMAL HIGH (ref 70–99)
Potassium: 4.2 mmol/L (ref 3.5–5.1)
Potassium: 4.2 mmol/L (ref 3.5–5.1)
Sodium: 141 mmol/L (ref 135–145)
Sodium: 139 mmol/L (ref 135–145)

## 2020-10-10 LAB — ECHOCARDIOGRAM COMPLETE
Area-P 1/2: 1.94 cm2
Height: 71 in
P 1/2 time: 444 msec
S' Lateral: 3.1 cm
Weight: 3777.8 oz

## 2020-10-10 LAB — LIPID PANEL
Cholesterol: 198 mg/dL (ref 0–200)
HDL: 34 mg/dL — ABNORMAL LOW (ref >40)
LDL Cholesterol: 120 mg/dL — ABNORMAL HIGH (ref 0–99)
Total CHOL/HDL Ratio: 5.8 RATIO
Triglycerides: 222 mg/dL — ABNORMAL HIGH (ref <150)
VLDL: 44 mg/dL — ABNORMAL HIGH (ref 0–40)

## 2020-10-10 LAB — CBC
HCT: 48.8 % (ref 39.0–52.0)
HCT: 55.6 % — ABNORMAL HIGH (ref 39.0–52.0)
Hemoglobin: 16.1 g/dL (ref 13.0–17.0)
Hemoglobin: 17.6 g/dL — ABNORMAL HIGH (ref 13.0–17.0)
MCH: 30.4 pg (ref 26.0–34.0)
MCH: 31.2 pg (ref 26.0–34.0)
MCHC: 31.7 g/dL (ref 30.0–36.0)
MCHC: 33 g/dL (ref 30.0–36.0)
MCV: 94.6 fL (ref 80.0–100.0)
MCV: 96.2 fL (ref 80.0–100.0)
Platelets: 283 10*3/uL (ref 150–400)
Platelets: 308 10*3/uL (ref 150–400)
RBC: 5.16 MIL/uL (ref 4.22–5.81)
RBC: 5.78 MIL/uL (ref 4.22–5.81)
RDW: 13.8 % (ref 11.5–15.5)
RDW: 13.8 % (ref 11.5–15.5)
WBC: 11.4 10*3/uL — ABNORMAL HIGH (ref 4.0–10.5)
WBC: 11.6 10*3/uL — ABNORMAL HIGH (ref 4.0–10.5)
nRBC: 0 % (ref 0.0–0.2)
nRBC: 0 % (ref 0.0–0.2)

## 2020-10-10 LAB — POCT ACTIVATED CLOTTING TIME
Activated Clotting Time: 242 seconds
Activated Clotting Time: 329 seconds
Activated Clotting Time: 190 seconds
Activated Clotting Time: 138 seconds

## 2020-10-10 LAB — PROTIME-INR
INR: 1 (ref 0.8–1.2)
Prothrombin Time: 13.4 seconds (ref 11.4–15.2)

## 2020-10-10 LAB — TROPONIN I (HIGH SENSITIVITY)
Troponin I (High Sensitivity): 584 ng/L (ref <18)
Troponin I (High Sensitivity): 2576 ng/L (ref <18)

## 2020-10-10 LAB — POCT I-STAT, CHEM 8
BUN: 15 mg/dL (ref 8–23)
Calcium, Ion: 1.22 mmol/L (ref 1.15–1.40)
Chloride: 106 mmol/L (ref 98–111)
Creatinine, Ser: 0.8 mg/dL (ref 0.61–1.24)
Glucose, Bld: 138 mg/dL — ABNORMAL HIGH (ref 70–99)
HCT: 50 % (ref 39.0–52.0)
Hemoglobin: 17 g/dL (ref 13.0–17.0)
Potassium: 3.6 mmol/L (ref 3.5–5.1)
Sodium: 141 mmol/L (ref 135–145)
TCO2: 20 mmol/L — ABNORMAL LOW (ref 22–32)

## 2020-10-10 LAB — RESP PANEL BY RT-PCR (FLU A&B, COVID) ARPGX2
Influenza A by PCR: NEGATIVE
Influenza B by PCR: NEGATIVE
SARS Coronavirus 2 by RT PCR: NEGATIVE

## 2020-10-10 LAB — MRSA NEXT GEN BY PCR, NASAL: MRSA by PCR Next Gen: NOT DETECTED

## 2020-10-10 LAB — GLUCOSE, CAPILLARY
Glucose-Capillary: 121 mg/dL — ABNORMAL HIGH (ref 70–99)
Glucose-Capillary: 119 mg/dL — ABNORMAL HIGH (ref 70–99)
Glucose-Capillary: 137 mg/dL — ABNORMAL HIGH (ref 70–99)
Glucose-Capillary: 111 mg/dL — ABNORMAL HIGH (ref 70–99)

## 2020-10-10 LAB — APTT: aPTT: 32 seconds (ref 24–36)

## 2020-10-10 LAB — HEMOGLOBIN A1C
Hgb A1c MFr Bld: 6.3 % — ABNORMAL HIGH (ref 4.8–5.6)
Mean Plasma Glucose: 134.11 mg/dL

## 2020-10-10 SURGERY — CORONARY/GRAFT ACUTE MI REVASCULARIZATION
Anesthesia: LOCAL

## 2020-10-10 MED ORDER — INSULIN ASPART 100 UNIT/ML IJ SOLN: 0.0000 [IU] | Freq: Every day | INTRAMUSCULAR | Status: DC

## 2020-10-10 MED ORDER — TIROFIBAN HCL IN NACL 5-0.9 MG/100ML-% IV SOLN
INTRAVENOUS | Status: AC
  Filled 2020-10-10: qty 100

## 2020-10-10 MED ORDER — CHLORHEXIDINE GLUCONATE CLOTH 2 % EX PADS
6.0000 | MEDICATED_PAD | Freq: Every day | CUTANEOUS | Status: DC
  Administered 2020-10-10 – 2020-10-11 (×2): 6 via TOPICAL

## 2020-10-10 MED ORDER — METOPROLOL TARTRATE 12.5 MG HALF TABLET
12.5000 mg | ORAL_TABLET | Freq: Two times a day (BID) | ORAL | Status: DC
  Administered 2020-10-10 – 2020-10-12 (×4): 12.5 mg via ORAL
  Filled 2020-10-10 (×4): qty 1

## 2020-10-10 MED ORDER — SODIUM CHLORIDE 0.9% FLUSH
3.0000 mL | Freq: Two times a day (BID) | INTRAVENOUS | Status: DC
  Administered 2020-10-10 – 2020-10-11 (×2): 3 mL via INTRAVENOUS

## 2020-10-10 MED ORDER — VERAPAMIL HCL 2.5 MG/ML IV SOLN
INTRAVENOUS | Status: AC
  Filled 2020-10-10: qty 2

## 2020-10-10 MED ORDER — HYDRALAZINE HCL 20 MG/ML IJ SOLN: 10.0000 mg | INTRAMUSCULAR | Status: AC | PRN

## 2020-10-10 MED ORDER — ASPIRIN 81 MG PO CHEW
81.0000 mg | CHEWABLE_TABLET | ORAL | Status: AC
  Administered 2020-10-11: 81 mg via ORAL
  Filled 2020-10-10: qty 1

## 2020-10-10 MED ORDER — SODIUM CHLORIDE 0.9 % IV SOLN: 250.0000 mL | INTRAVENOUS | Status: DC | PRN

## 2020-10-10 MED ORDER — TIROFIBAN HCL IN NACL 5-0.9 MG/100ML-% IV SOLN
0.1500 ug/kg/min | INTRAVENOUS | Status: AC
  Administered 2020-10-10: 0.15 ug/kg/min via INTRAVENOUS

## 2020-10-10 MED ORDER — MORPHINE SULFATE (PF) 2 MG/ML IV SOLN: 2.0000 mg | INTRAVENOUS | Status: DC | PRN

## 2020-10-10 MED ORDER — ONDANSETRON HCL 4 MG/2ML IJ SOLN: 4.0000 mg | Freq: Four times a day (QID) | INTRAMUSCULAR | Status: DC | PRN

## 2020-10-10 MED ORDER — SODIUM CHLORIDE 0.9 % IV SOLN: INTRAVENOUS | Status: AC

## 2020-10-10 MED ORDER — ATROPINE SULFATE 1 MG/10ML IJ SOSY
PREFILLED_SYRINGE | INTRAMUSCULAR | Status: DC | PRN
Start: 2020-10-10 — End: 2020-10-10
  Administered 2020-10-10 (×2): 0.5 mg via INTRAVENOUS

## 2020-10-10 MED ORDER — TIROFIBAN HCL IN NACL 5-0.9 MG/100ML-% IV SOLN
INTRAVENOUS | Status: AC | PRN
  Administered 2020-10-10: 0.15 ug/kg/min via INTRAVENOUS

## 2020-10-10 MED ORDER — HEPARIN SODIUM (PORCINE) 1000 UNIT/ML IJ SOLN
INTRAMUSCULAR | Status: DC | PRN
  Administered 2020-10-10: 2000 [IU] via INTRAVENOUS
  Administered 2020-10-10: 4000 [IU] via INTRAVENOUS
  Administered 2020-10-10: 7000 [IU] via INTRAVENOUS

## 2020-10-10 MED ORDER — FUROSEMIDE 10 MG/ML IJ SOLN
40.0000 mg | Freq: Once | INTRAMUSCULAR | Status: AC
  Administered 2020-10-10: 40 mg via INTRAVENOUS
  Filled 2020-10-10: qty 4

## 2020-10-10 MED ORDER — TIROFIBAN (AGGRASTAT) BOLUS VIA INFUSION
INTRAVENOUS | Status: DC | PRN
  Administered 2020-10-10: 2777.5 ug via INTRAVENOUS

## 2020-10-10 MED ORDER — LIDOCAINE HCL (PF) 1 % IJ SOLN
INTRAMUSCULAR | Status: AC
  Filled 2020-10-10: qty 30

## 2020-10-10 MED ORDER — LIDOCAINE HCL (PF) 1 % IJ SOLN
INTRAMUSCULAR | Status: DC | PRN
  Administered 2020-10-10: 2 mL
  Administered 2020-10-10: 20 mL

## 2020-10-10 MED ORDER — IOHEXOL 350 MG/ML SOLN
INTRAVENOUS | Status: DC | PRN
  Administered 2020-10-10: 175 mL

## 2020-10-10 MED ORDER — PNEUMOCOCCAL VAC POLYVALENT 25 MCG/0.5ML IJ INJ
0.5000 mL | INJECTION | INTRAMUSCULAR | Status: DC | PRN
  Filled 2020-10-10: qty 0.5

## 2020-10-10 MED ORDER — HEPARIN (PORCINE) IN NACL 1000-0.9 UT/500ML-% IV SOLN
INTRAVENOUS | Status: DC | PRN
  Administered 2020-10-10 (×2): 500 mL

## 2020-10-10 MED ORDER — ATORVASTATIN CALCIUM 80 MG PO TABS
80.0000 mg | ORAL_TABLET | Freq: Every day | ORAL | Status: DC
  Administered 2020-10-10: 80 mg via ORAL
  Filled 2020-10-10: qty 1

## 2020-10-10 MED ORDER — SODIUM CHLORIDE 0.9% FLUSH: 3.0000 mL | INTRAVENOUS | Status: DC | PRN

## 2020-10-10 MED ORDER — TICAGRELOR 90 MG PO TABS
ORAL_TABLET | ORAL | Status: DC | PRN
  Administered 2020-10-10: 180 mg via ORAL

## 2020-10-10 MED ORDER — LABETALOL HCL 5 MG/ML IV SOLN: 10.0000 mg | INTRAVENOUS | Status: DC | PRN

## 2020-10-10 MED ORDER — INSULIN ASPART 100 UNIT/ML IJ SOLN
0.0000 [IU] | Freq: Three times a day (TID) | INTRAMUSCULAR | Status: DC
  Administered 2020-10-10 – 2020-10-12 (×3): 2 [IU] via SUBCUTANEOUS

## 2020-10-10 MED ORDER — ASPIRIN EC 81 MG PO TBEC
324.0000 mg | DELAYED_RELEASE_TABLET | Freq: Every day | ORAL | Status: DC
  Filled 2020-10-10: qty 4

## 2020-10-10 MED ORDER — HEPARIN SODIUM (PORCINE) 1000 UNIT/ML IJ SOLN
INTRAMUSCULAR | Status: AC
  Filled 2020-10-10: qty 1

## 2020-10-10 MED ORDER — SODIUM CHLORIDE 0.9% FLUSH: 3.0000 mL | Freq: Two times a day (BID) | INTRAVENOUS | Status: DC

## 2020-10-10 MED ORDER — TICAGRELOR 90 MG PO TABS
90.0000 mg | ORAL_TABLET | Freq: Two times a day (BID) | ORAL | Status: DC
  Administered 2020-10-10 – 2020-10-12 (×5): 90 mg via ORAL
  Filled 2020-10-10 (×4): qty 1

## 2020-10-10 MED ORDER — SODIUM CHLORIDE 0.9 % IV SOLN
INTRAVENOUS | Status: AC | PRN
  Administered 2020-10-10: 10 mL/h via INTRAVENOUS

## 2020-10-10 MED ORDER — ATROPINE SULFATE 1 MG/10ML IJ SOSY
PREFILLED_SYRINGE | INTRAMUSCULAR | Status: AC
  Filled 2020-10-10: qty 10

## 2020-10-10 MED ORDER — HEPARIN (PORCINE) IN NACL 1000-0.9 UT/500ML-% IV SOLN
INTRAVENOUS | Status: AC
  Filled 2020-10-10: qty 1000

## 2020-10-10 MED ORDER — PRAVASTATIN SODIUM 40 MG PO TABS: 40.0000 mg | ORAL_TABLET | Freq: Every day | ORAL | Status: DC

## 2020-10-10 MED ORDER — VERAPAMIL HCL 2.5 MG/ML IV SOLN
INTRAVENOUS | Status: DC | PRN
  Administered 2020-10-10: 10 mL via INTRA_ARTERIAL

## 2020-10-10 MED ORDER — PERFLUTREN LIPID MICROSPHERE
1.0000 mL | INTRAVENOUS | Status: AC | PRN
  Administered 2020-10-10: 2.5 mL via INTRAVENOUS
  Filled 2020-10-10: qty 10

## 2020-10-10 MED ORDER — NITROGLYCERIN 1 MG/10 ML FOR IR/CATH LAB
INTRA_ARTERIAL | Status: AC
  Filled 2020-10-10: qty 10

## 2020-10-10 MED ORDER — ASPIRIN 81 MG PO CHEW
81.0000 mg | CHEWABLE_TABLET | Freq: Every day | ORAL | Status: DC
  Administered 2020-10-10 – 2020-10-12 (×2): 81 mg via ORAL
  Filled 2020-10-10 (×2): qty 1

## 2020-10-10 MED ORDER — SODIUM CHLORIDE 0.9 % IV SOLN: INTRAVENOUS | Status: DC

## 2020-10-10 MED ORDER — ACETAMINOPHEN 325 MG PO TABS: 650.0000 mg | ORAL_TABLET | ORAL | Status: DC | PRN

## 2020-10-10 SURGICAL SUPPLY — 23 items
BALLN SAPPHIRE 2.0X12 (BALLOONS) ×2
BALLN SAPPHIRE ~~LOC~~ 3.25X12 (BALLOONS) ×2 IMPLANT
BALLN SAPPHIRE ~~LOC~~ 3.25X18 (BALLOONS) ×2 IMPLANT
BALLOON SAPPHIRE 2.0X12 (BALLOONS) ×1 IMPLANT
CATH 5FR JL3.5 JR4 ANG PIG MP (CATHETERS) ×2 IMPLANT
CATH INFINITI 5 FR 3DRC (CATHETERS) ×2 IMPLANT
CATH OPTITORQUE TIG 4.0 5F (CATHETERS) ×2 IMPLANT
CATH VISTA GUIDE 6FR JR4 (CATHETERS) ×2 IMPLANT
DEVICE RAD COMP TR BAND LRG (VASCULAR PRODUCTS) ×2 IMPLANT
GLIDESHEATH SLEND A-KIT 6F 22G (SHEATH) ×2 IMPLANT
GUIDEWIRE INQWIRE 1.5J.035X260 (WIRE) ×1 IMPLANT
INQWIRE 1.5J .035X260CM (WIRE) ×2
KIT ENCORE 26 ADVANTAGE (KITS) ×2 IMPLANT
KIT HEART LEFT (KITS) ×2 IMPLANT
PACK CARDIAC CATHETERIZATION (CUSTOM PROCEDURE TRAY) ×2 IMPLANT
SHEATH PINNACLE 6F 10CM (SHEATH) ×2 IMPLANT
SHEATH PROBE COVER 6X72 (BAG) ×2 IMPLANT
STENT ONYX FRONTIER 3.0X22 (Permanent Stent) ×2 IMPLANT
TRANSDUCER W/STOPCOCK (MISCELLANEOUS) ×2 IMPLANT
TUBING CIL FLEX 10 FLL-RA (TUBING) ×2 IMPLANT
WIRE ASAHI PROWATER 180CM (WIRE) ×2 IMPLANT
WIRE EMERALD 3MM-J .035X150CM (WIRE) ×2 IMPLANT
WIRE HI TORQ VERSACORE-J 145CM (WIRE) ×2 IMPLANT

## 2020-10-10 NOTE — Progress Notes (Signed)
CARDIAC REHAB PHASE I   PRE:  Rate/Rhythm: 57 SB    BP: sitting 117/65    SaO2: 97 RA  MODE:  Ambulation: 370 ft   POST:  Rate/Rhythm: 70 SR    BP: sitting 102/74     SaO2: 96 RA  Tolerated well, no c/o walking. Does endorse episodic SOB that seems to be related to [place]. Pt sts it makes him worry.   Discussed with pt and son MI, stent, Brilinta, restrictions, diet, smoking cessation, and CRPII. Very receptive. Wanting to quit smoking. Gave resources and tips. Will refer to [place]. He understands the importance of Brilinta. Will f/u after PCI. [place], ACSM 10/10/2020 2:21 PM

## 2020-10-10 NOTE — Progress Notes (Signed)
  Echocardiogram 2D Echocardiogram has been performed.  Joseph Wilkerson 10/10/2020, 11:49 AM

## 2020-10-10 NOTE — Progress Notes (Signed)
Sheath Removal Note  Sheath removed at 0530. Manual pressure held for 17mn by AJill Side RN, with this RN assisting. Site level 0, dressed with gauze and tegaderm. Pt tolerated well.    RHenreitta Leber RN 10/10/20

## 2020-10-10 NOTE — TOC Benefit Eligibility Note (Addendum)
Patient Teacher, English as a foreign language completed.    The patient is currently admitted and upon discharge could be taking Brilinta 90 mg.  The current 30 day co-pay is, $282.42 due to a $936.78 deductible remaining.   The patient is currently admitted and upon discharge could be taking Vascepa 1 g.  Non Formulary  The patient is insured through [place] Patient Advocate Specialist [place] Team Direct Number: [phone]  Fax: [phone]

## 2020-10-10 NOTE — Progress Notes (Signed)
Progress Note  Patient Name: Joseph Wilkerson Date of Encounter: 10/10/2020  [facility] Cardiologist: Quay Burow, MD   Subjective   No chest pain. Mild dyspnea  Inpatient Medications    Scheduled Meds:  aspirin  81 mg Oral Daily   aspirin EC  324 mg Oral Daily   atorvastatin  80 mg Oral Daily   heparin       insulin aspart  0-15 Units Subcutaneous TID WC   insulin aspart  0-5 Units Subcutaneous QHS   sodium chloride flush  3 mL Intravenous Q12H   ticagrelor  90 mg Oral BID   Continuous Infusions:  sodium chloride Stopped (10/10/20 0234)   sodium chloride 75 mL/hr at 10/10/20 0700   sodium chloride     PRN Meds: sodium chloride, acetaminophen, morphine injection, ondansetron (ZOFRAN) IV, sodium chloride flush   Vital Signs    Vitals:   10/10/20 0710 10/10/20 0715 10/10/20 0720 10/10/20 0817  BP:  128/84    Pulse: 65 (!) 57 64   Resp: 19 16 (!) 21   Temp:    97.8 F (36.6 C)  TempSrc:    Oral  SpO2: 97% 96% 98%   Weight:      Height:        Intake/Output Summary (Last 24 hours) at 10/10/2020 0931 Last data filed at 10/10/2020 0800 Gross per 24 hour  Intake 1207.28 ml  Output 625 ml  Net 582.28 ml   Last 3 Weights 10/10/2020 10/10/2020 11/12/2018  Weight (lbs) 236 lb 1.8 oz 245 lb 234 lb  Weight (kg) 107.1 kg 111.13 kg 106.142 kg      Telemetry    Sinus, short runs of NSVT - Personally Reviewed  ECG    Sinus with 1st degree AV block, incomplete RBBB - Personally Reviewed  Physical Exam   GEN: No acute distress.   Neck: No JVD Cardiac: RRR, no murmurs, rubs, or gallops.  Respiratory: Basilar crackles.  GI: Soft, nontender, non-distended  MS: No edema; No deformity. Neuro:  Nonfocal  Psych: Normal affect   Labs    High Sensitivity Troponin:   Recent Labs  Lab 10/09/20 2330 10/10/20 0225  TROPONINIHS 584* 2,576*      Chemistry Recent Labs  Lab 10/09/20 2330 10/10/20 0048 10/10/20 0225  NA 141 141 139  K 4.2 3.6 4.2  CL  105 106 108  CO2 23  --  21*  GLUCOSE 150* 138* 117*  BUN '15 15 13  ' CREATININE 1.11 0.80 0.94  CALCIUM 9.9  --  9.0  GFRNONAA >60  --  >60  ANIONGAP 13  --  10     Hematology Recent Labs  Lab 10/09/20 2330 10/10/20 0048 10/10/20 0225  WBC 11.6*  --  11.4*  RBC 5.78  --  5.16  HGB 17.6* 17.0 16.1  HCT 55.6* 50.0 48.8  MCV 96.2  --  94.6  MCH 30.4  --  31.2  MCHC 31.7  --  33.0  RDW 13.8  --  13.8  PLT 308  --  283    BNPNo results for input(s): BNP, PROBNP in the last 168 hours.   DDimer No results for input(s): DDIMER in the last 168 hours.   Radiology    CARDIAC CATHETERIZATION  Result Date: 10/10/2020 Formatting of this result is different from the original. Images from the original result were not included.   Prox Cx to Mid Cx lesion is 90% stenosed.   Ost RCA to Prox RCA  lesion is 100% stenosed.   A drug-eluting stent was successfully placed using a STENT ONYX FRONTIER 3.0X22.   Post intervention, there is a 0% residual stenosis. Joseph Wilkerson is a 65 y.o. male  [id number] LOCATION:  FACILITY: [facility] PHYSICIAN: Quay Burow, M.D. 11/09/1955 DATE OF PROCEDURE:  10/10/2020 DATE OF DISCHARGE: CARDIAC CATHETERIZATION / PCI DES RCA History obtained from chart review.  Mr. Shadowens is a 65 year old mildly overweight widowed Caucasian male father of 3 children who works as a [employer] manager.  His risk factors include family history, diabetes, hypertension, hyperlipidemia and ongoing tobacco abuse of 1 pack/day.  He developed chest pain around 830 this evening and was brought to the emergency room where his EKG showed inferolateral ST segment elevation.  He was hemodynamically stable.  He is brought urgently to the Cath Lab for angiography and potential intervention. PROCEDURE DESCRIPTION: The patient was brought to the second floor [facility] cath lab in the postabsorptive state. He was not Premedicated. Marland Kitchen His right wrist and groin Were prepped and shaved in  usual sterile fashion. Xylocaine 1% was used for local anesthesia. A 6 French sheath was inserted into the right radial  artery using standard Seldinger technique.  A 6 French sheath was inserted into the right common femoral artery as well.  The patient received 4000 units  of heparin intravenously in the emergency room and additional 2000 prior to angiography.  5 [place] TIG catheter, right Judkins catheter and already 3 catheter were used for selective coronary angiography.  Unfortunately, I was unable to visualize the right coronary artery and had to get right common femoral access.  Right Judkins catheter was then able to identify the occluded RCA.  Isovue dye was used for the entirety of the case (total of 175 cc of contrast administered to the patient)..  Radial cocktail was administered through the SideArm sheath. The patient received a total of 13 thousandths of heparin with an ending ACT of 329.  He received 180 mg of p.o. Brilinta.  Using a 6 [place] JR4 guide catheter along with 0.14 [place] guidewire I was able to cross the proximal occluded RCA lesion with a door to balloon time of 52 minutes.  This was delayed because of tortuosity in the subclavian system and having to get femoral access. There was visible thrombus noted at the site of obstruction.  I did give a bolus of Aggrastat and placed him on a drip.  I predilated with a 2 mm x 12 mm balloon and placed a 3 mm x 22 mm long Medtronic frontier drug-eluting stent deployed at 14 atm.  I postdilated with a 3.25 x 12 mm balloon at 16 atm (3.3 mm) resulting reduction of a thrombotically occluded proximal dominant RCA stenosis to 0% residual with TIMI-3 flow.  There were faint left-to-right collaterals prior to intervention. With restoration of antegrade flow the patient became bradycardic and hypotensive.  He received 1 mg of IV atropine as well as a 500 cc bolus of intravenous fluids which brought his blood pressure up from the 40-08 range systolic to  the low 676 range.   Successful RCA PCI drug-eluting stenting in the setting of inferolateral STEMI of a dominant RCA using a 3 mm frontier drug-eluting stent postdilated to 3.3 mm.  The patient did have some visible thrombus which resolved with stent deployment however he did receive Aggrastat bolus and will get a 6-hour infusion.  He does have a residual 80% circumflex stenosis on  a bend which may pose some technical challenges.  I suspect that this should probably be approached femorally.  A 2D echocardiogram will be obtained.  He will need to be on dual antiplatelet therapy uninterrupted for 12 months.  Cardiac risk factor modification should be aggressively pursued including smoking cessation, as well as guideline directed optimal medical therapy with high-dose statin therapy.  The right radial sheath was removed and a TR band was placed on the wrist to achieve patent hemostasis.  The femoral sheath was sewn securely in place and will be removed once ACT falls below 170.  The patient left lab in stable condition. Quay Burow. MD, [facility] 10/10/2020 1:42 AM    DG Chest Port 1 View  Result Date: 10/09/2020 CLINICAL DATA:  Sudden onset of chest pain. EXAM: PORTABLE CHEST 1 VIEW COMPARISON:  None. FINDINGS: Mild cardiomegaly. There is aortic tortuosity. Mild peribronchial thickening. No focal airspace disease. No pleural effusion or pneumothorax. No acute osseous abnormalities are seen. IMPRESSION: 1. Mild cardiomegaly and aortic tortuosity. 2. Mild peribronchial thickening. Electronically Signed   By: Keith Rake M.D.   On: 10/09/2020 23:47    Cardiac Studies     Patient Profile     65 y.o. male with history of HTN, HLD, DM, sleep apnea and tobacco abuse admitted with inferior STEMI secondary to thrombotic occlusion of the proximal RCA, treated with a drug eluting stent. Residual severe stenosis in the mid Circumflex artery.   Assessment & Plan    CAD/inferior STEMI: Pt admitted on the evening  of 10/09/20 with an inferior STEMI. The proximal RCA was occluded and was treated with a drug eluting stent. He is doing well this am. No chest pain. Tele with sinus with short runs of NSVT.  -will continue DAPT with ASA and Brilinta -Continue high intensity statin.  -will start low dose beta blocker -Plan staged PCI of Circumflex tomorrow. NPO at midnight -Echo today  NSVT: Short runs of NSVT overnight. Will start low dose beta blocker  Acute diastolic CHF: Echo today to assess LVEF. Will give one time time of IV Lasix.    For questions or updates, please contact [place] Please consult [email or web address] for contact info under        Signed, Lauree Chandler, MD  10/10/2020, 9:31 AM

## 2020-10-10 NOTE — Plan of Care (Signed)
  Problem: Education: Goal: Knowledge of General Education information will improve Description: Including pain rating scale, medication(s)/side effects and non-pharmacologic comfort measures Outcome: Progressing   Problem: Health Behavior/Discharge Planning: Goal: Ability to manage health-related needs will improve Outcome: Progressing   Problem: Clinical Measurements: Goal: Ability to maintain clinical measurements within normal limits will improve Outcome: Progressing Goal: Will remain free from infection Outcome: Progressing Goal: Diagnostic test results will improve Outcome: Progressing Goal: Respiratory complications will improve Outcome: Progressing Goal: Cardiovascular complication will be avoided Outcome: Progressing   Problem: Activity: Goal: Risk for activity intolerance will decrease Outcome: Progressing   Problem: Nutrition: Goal: Adequate nutrition will be maintained Outcome: Progressing   Problem: Coping: Goal: Level of anxiety will decrease Outcome: Progressing   Problem: Elimination: Goal: Will not experience complications related to bowel motility Outcome: Progressing Goal: Will not experience complications related to urinary retention Outcome: Progressing   Problem: Pain Managment: Goal: General experience of comfort will improve Outcome: Progressing   Problem: Safety: Goal: Ability to remain free from injury will improve Outcome: Progressing   Problem: Skin Integrity: Goal: Risk for impaired skin integrity will decrease Outcome: Progressing   Problem: Education: Goal: Understanding of cardiac disease, CV risk reduction, and recovery process will improve Outcome: Progressing Goal: Understanding of medication regimen will improve Outcome: Progressing Goal: Individualized Educational Video(s) Outcome: Progressing   Problem: Activity: Goal: Ability to tolerate increased activity will improve Outcome: Progressing   Problem: Cardiac: Goal:  Ability to achieve and maintain adequate cardiopulmonary perfusion will improve Outcome: Progressing Goal: Vascular access site(s) Level 0-1 will be maintained Outcome: Progressing   Problem: Health Behavior/Discharge Planning: Goal: Ability to safely manage health-related needs after discharge will improve Outcome: Progressing   Problem: Education: Goal: Understanding of CV disease, CV risk reduction, and recovery process will improve Outcome: Progressing Goal: Individualized Educational Video(s) Outcome: Progressing   Problem: Activity: Goal: Ability to return to baseline activity level will improve Outcome: Progressing   Problem: Cardiovascular: Goal: Ability to achieve and maintain adequate cardiovascular perfusion will improve Outcome: Progressing Goal: Vascular access site(s) Level 0-1 will be maintained Outcome: Progressing   Problem: Health Behavior/Discharge Planning: Goal: Ability to safely manage health-related needs after discharge will improve Outcome: Progressing

## 2020-10-10 NOTE — ED Provider Notes (Addendum)
[facility] EMERGENCY DEPARTMENT Provider Note   CSN: [id number] Arrival date & time: 10/09/20  2259     History Chief Complaint  Patient presents with   Chest Pain    Joseph Wilkerson is a 65 y.o. male.  Patient presents to the emergency department for evaluation of chest pain.  Patient reports that he was at rest when he developed sudden onset of chest pain around 8:30 PM.  Patient reports that he became very clammy and sweaty, pain radiating to the left arm.  He took a full dose aspirin at home.  Patient does have history of diabetes, hypertension, high cholesterol, BMI greater than 30, smoking, father died at 27 of a heart attack.      Past Medical History:  Diagnosis Date   Allergic rhinitis    Allergy    Arthritis    Depression    Diabetes (HCC)    GERD (gastroesophageal reflux disease)    Hemorrhoids    HTN (hypertension)    Hyperlipidemia    Nephrolithiasis    OSA (obstructive sleep apnea)    Sleep apnea     There are no problems to display for this patient.   Past Surgical History:  Procedure Laterality Date   COLONOSCOPY     KYPHOPLASTY  06/18/2012   sec L Comp Fx       Family History  Problem Relation Age of Onset   CAD Father    Heart attack Father    Cancer Mother        ? mets   Diabetes Brother    CAD Brother    Colon cancer Neg Hx    Pancreatic cancer Neg Hx    Rectal cancer Neg Hx    Stomach cancer Neg Hx    Esophageal cancer Neg Hx     Social History   Tobacco Use   Smoking status: Every Day    Packs/day: 1.00    Types: Cigarettes   Smokeless tobacco: Never  Vaping Use   Vaping Use: Never used  Substance Use Topics   Alcohol use: Yes    Comment: Socillay-onc per month   Drug use: No    Home Medications Prior to Admission medications   Medication Sig Start Date End Date Taking? Authorizing Provider  aspirin EC 81 MG tablet Take 324 mg by mouth daily.     [provider]  fluticasone (FLONASE)  50 MCG/ACT nasal spray USE 2 SPRAY(S) IN EACH NOSTRIL ONCE DAILY 05/30/18   [provider]  ibuprofen (ADVIL,MOTRIN) 200 MG tablet Take 400 mg by mouth 3 (three) times daily as needed for pain.    [provider]  irbesartan (AVAPRO) 300 MG tablet Take 300 mg by mouth daily. 10/03/18   [provider]  metFORMIN (GLUCOPHAGE) 500 MG tablet TAKE 2 TABLETS BY MOUTH TWICE DAILY WITH BREAKFAST AND DINNER 07/08/18   [provider]  ONETOUCH VERIO test strip USE 1 STRIP TO [place] DAILY 10/25/18   [provider]  pravastatin (PRAVACHOL) 40 MG tablet Take 40 mg by mouth daily.    [provider]  PRESCRIPTION MEDICATION Acetonide cream for poison ivy    [provider]  STEGLATRO 5 MG TABS Take 1 tablet by mouth daily. 10/21/18   [provider]    Allergies    Patient has no known allergies.  Review of Systems   Review of Systems  Constitutional:  Positive for diaphoresis.  Cardiovascular:  Positive for  chest pain.  All other systems reviewed and are negative.  Physical Exam Updated Vital Signs BP (!) 148/85   Pulse 74   Temp 98 F (36.7 C) (Oral)   Resp 19   SpO2 96%   Physical Exam Vitals and nursing note reviewed.  Constitutional:      General: He is not in acute distress.    Appearance: Normal appearance. He is well-developed.  HENT:     Head: Normocephalic and atraumatic.     Right Ear: Hearing normal.     Left Ear: Hearing normal.     Nose: Nose normal.  Eyes:     Conjunctiva/sclera: Conjunctivae normal.     Pupils: Pupils are equal, round, and reactive to light.  Cardiovascular:     Rate and Rhythm: Regular rhythm.     Heart sounds: S1 normal and S2 normal. No murmur heard.   No friction rub. No gallop.  Pulmonary:     Effort: Pulmonary effort is normal. No respiratory distress.     Breath sounds: Normal breath sounds.  Chest:     Chest wall: No tenderness.  Abdominal:     General: Bowel  sounds are normal.     Palpations: Abdomen is soft.     Tenderness: There is no abdominal tenderness. There is no guarding or rebound. Negative signs include Murphy's sign and McBurney's sign.     Hernia: No hernia is present.  Musculoskeletal:        General: Normal range of motion.     Cervical back: Normal range of motion and neck supple.  Skin:    General: Skin is warm and dry.     Findings: No rash.  Neurological:     Mental Status: He is alert and oriented to person, place, and time.     GCS: GCS eye subscore is 4. GCS verbal subscore is 5. GCS motor subscore is 6.     Cranial Nerves: No cranial nerve deficit.     Sensory: No sensory deficit.     Coordination: Coordination normal.  Psychiatric:        Speech: Speech normal.        Behavior: Behavior normal.        Thought Content: Thought content normal.    ED Results / Procedures / Treatments   Labs (all labs ordered are listed, but only abnormal results are displayed) Labs Reviewed  RESP PANEL BY RT-PCR (FLU A&B, COVID) ARPGX2  BASIC METABOLIC PANEL  CBC  HEMOGLOBIN A1C  PROTIME-INR  APTT  LIPID PANEL  TROPONIN I (HIGH SENSITIVITY)    EKG EKG Interpretation  Date/Time:  Tuesday October 09 2020 23:19:51 EDT Ventricular Rate:  69 PR Interval:  198 QRS Duration: 116 QT Interval:  402 QTC Calculation: 430 R Axis:   -17 Text Interpretation: ** Critical Test Result: STEMI Normal sinus rhythm Incomplete right bundle branch block Cannot rule out Anterior infarct , age undetermined Inferior injury pattern ** ** ACUTE MI / STEMI ** ** Consider right ventricular involvement in acute inferior infarct Abnormal ECG Confirmed by Orpah Greek [phone]) on 10/10/2020 1:04:30 AM  Radiology DG Chest Port 1 View  Result Date: 10/09/2020 CLINICAL DATA:  Sudden onset of chest pain. EXAM: PORTABLE CHEST 1 VIEW COMPARISON:  None. FINDINGS: Mild cardiomegaly. There is aortic tortuosity. Mild peribronchial thickening. No focal  airspace disease. No pleural effusion or pneumothorax. No acute osseous abnormalities are seen. IMPRESSION: 1. Mild cardiomegaly and aortic tortuosity. 2. Mild peribronchial thickening. Electronically Signed  By: Keith Rake M.D.   On: 10/09/2020 23:47    Procedures Procedures   Medications Ordered in ED Medications  heparin 5000 UNIT/ML injection (has no administration in time range)  0.9 %  sodium chloride infusion ( Intravenous New Bag/Given 10/09/20 2354)    ED Course  I have reviewed the triage vital signs and the nursing notes.  Pertinent labs & imaging results that were available during my care of the patient were reviewed by me and considered in my medical decision making (see chart for details).    MDM Rules/Calculators/A&P                           Patient presents to the emergency department for evaluation of chest pain.  EKG concerning for inferior MI.  Code STEMI called.  Patient's vitals are stable.  He is currently hypertensive, will withhold nitrate administration based on EKG.  Patient has taken a full dose aspirin prior to coming to the ER.  Heparin bolus initiated.  Will be taken to the Cath Lab by cardiology.  CRITICAL CARE Performed by: Orpah Greek   Total critical care time: 30 minutes  Critical care time was exclusive of separately billable procedures and treating other patients.  Critical care was necessary to treat or prevent imminent or life-threatening deterioration.  Critical care was time spent personally by me on the following activities: development of treatment plan with patient and/or surrogate as well as nursing, discussions with consultants, evaluation of patient's response to treatment, examination of patient, obtaining history from patient or surrogate, ordering and performing treatments and interventions, ordering and review of laboratory studies, ordering and review of radiographic studies, pulse oximetry and re-evaluation of  patient's condition.  Final Clinical Impression(s) / ED Diagnoses Final diagnoses:  ST elevation myocardial infarction (STEMI), unspecified artery [facility])    Rx / DC Orders ED Discharge Orders     None        Shaindy Reader, Gwenyth Allegra, MD 10/10/20 0018    Orpah Greek, MD 10/10/20 0105

## 2020-10-11 ENCOUNTER — Inpatient Hospital Stay (HOSPITAL_COMMUNITY)
Admission: EM | Disposition: A | Discharge status: Home / Self Care | Payer: Self-pay | Source: Home / Self Care | Attending: Cardiovascular Disease

## 2020-10-11 ENCOUNTER — Encounter (HOSPITAL_COMMUNITY): Payer: Self-pay | Admitting: Cardiovascular Disease

## 2020-10-11 HISTORY — PX: CORONARY STENT INTERVENTION: CATH118234

## 2020-10-11 LAB — GLUCOSE, CAPILLARY
Glucose-Capillary: 119 mg/dL — ABNORMAL HIGH (ref 70–99)
Glucose-Capillary: 120 mg/dL — ABNORMAL HIGH (ref 70–99)
Glucose-Capillary: 95 mg/dL (ref 70–99)
Glucose-Capillary: 128 mg/dL — ABNORMAL HIGH (ref 70–99)

## 2020-10-11 LAB — BASIC METABOLIC PANEL
Anion gap: 9 (ref 5–15)
BUN: 17 mg/dL (ref 8–23)
CO2: 22 mmol/L (ref 22–32)
Calcium: 9.2 mg/dL (ref 8.9–10.3)
Chloride: 106 mmol/L (ref 98–111)
Creatinine, Ser: 1 mg/dL (ref 0.61–1.24)
GFR, Estimated: >60 mL/min (ref >60)
Glucose, Bld: 109 mg/dL — ABNORMAL HIGH (ref 70–99)
Potassium: 3.5 mmol/L (ref 3.5–5.1)
Sodium: 137 mmol/L (ref 135–145)

## 2020-10-11 LAB — CBC
HCT: 45.6 % (ref 39.0–52.0)
Hemoglobin: 14.9 g/dL (ref 13.0–17.0)
MCH: 30.8 pg (ref 26.0–34.0)
MCHC: 32.7 g/dL (ref 30.0–36.0)
MCV: 94.4 fL (ref 80.0–100.0)
Platelets: 290 10*3/uL (ref 150–400)
RBC: 4.83 MIL/uL (ref 4.22–5.81)
RDW: 14.1 % (ref 11.5–15.5)
WBC: 12.3 10*3/uL — ABNORMAL HIGH (ref 4.0–10.5)
nRBC: 0 % (ref 0.0–0.2)

## 2020-10-11 LAB — POCT ACTIVATED CLOTTING TIME
Activated Clotting Time: 231 seconds
Activated Clotting Time: 283 seconds

## 2020-10-11 SURGERY — CORONARY STENT INTERVENTION
Anesthesia: LOCAL

## 2020-10-11 MED ORDER — SODIUM CHLORIDE 0.9 % IV SOLN: 250.0000 mL | INTRAVENOUS | Status: DC | PRN

## 2020-10-11 MED ORDER — SODIUM CHLORIDE 0.9% FLUSH: 3.0000 mL | INTRAVENOUS | Status: DC | PRN

## 2020-10-11 MED ORDER — TICAGRELOR 90 MG PO TABS
ORAL_TABLET | ORAL | Status: AC
  Filled 2020-10-11: qty 1

## 2020-10-11 MED ORDER — TICAGRELOR 90 MG PO TABS
90.0000 mg | ORAL_TABLET | Freq: Two times a day (BID) | ORAL | Status: DC
  Filled 2020-10-11: qty 1

## 2020-10-11 MED ORDER — LABETALOL HCL 5 MG/ML IV SOLN: 10.0000 mg | INTRAVENOUS | Status: AC | PRN

## 2020-10-11 MED ORDER — NITROGLYCERIN 1 MG/10 ML FOR IR/CATH LAB
INTRA_ARTERIAL | Status: AC
  Filled 2020-10-11: qty 10

## 2020-10-11 MED ORDER — ACETAMINOPHEN 325 MG PO TABS
650.0000 mg | ORAL_TABLET | ORAL | Status: DC | PRN
Start: 2020-10-11 — End: 2020-10-12

## 2020-10-11 MED ORDER — MORPHINE SULFATE (PF) 2 MG/ML IV SOLN: 2.0000 mg | INTRAVENOUS | Status: DC | PRN

## 2020-10-11 MED ORDER — SODIUM CHLORIDE 0.9% FLUSH
3.0000 mL | Freq: Two times a day (BID) | INTRAVENOUS | Status: DC
  Administered 2020-10-11: 3 mL via INTRAVENOUS

## 2020-10-11 MED ORDER — HEPARIN SODIUM (PORCINE) 1000 UNIT/ML IJ SOLN
INTRAMUSCULAR | Status: AC
  Filled 2020-10-11: qty 1

## 2020-10-11 MED ORDER — HEPARIN (PORCINE) IN NACL 1000-0.9 UT/500ML-% IV SOLN
INTRAVENOUS | Status: AC
  Filled 2020-10-11: qty 1000

## 2020-10-11 MED ORDER — ASPIRIN 81 MG PO CHEW: 81.0000 mg | CHEWABLE_TABLET | Freq: Every day | ORAL | Status: DC

## 2020-10-11 MED ORDER — HYDRALAZINE HCL 20 MG/ML IJ SOLN: 10.0000 mg | INTRAMUSCULAR | Status: AC | PRN

## 2020-10-11 MED ORDER — ONDANSETRON HCL 4 MG/2ML IJ SOLN: 4.0000 mg | Freq: Four times a day (QID) | INTRAMUSCULAR | Status: DC | PRN

## 2020-10-11 MED ORDER — HEPARIN (PORCINE) IN NACL 1000-0.9 UT/500ML-% IV SOLN
INTRAVENOUS | Status: DC | PRN
  Administered 2020-10-11: 500 mL

## 2020-10-11 MED ORDER — LIDOCAINE HCL (PF) 1 % IJ SOLN
INTRAMUSCULAR | Status: AC
  Filled 2020-10-11: qty 30

## 2020-10-11 MED ORDER — VERAPAMIL HCL 2.5 MG/ML IV SOLN
INTRAVENOUS | Status: AC
  Filled 2020-10-11: qty 2

## 2020-10-11 MED ORDER — HEPARIN SODIUM (PORCINE) 1000 UNIT/ML IJ SOLN
INTRAMUSCULAR | Status: DC | PRN
  Administered 2020-10-11: 11000 [IU] via INTRAVENOUS

## 2020-10-11 MED ORDER — POTASSIUM CHLORIDE CRYS ER 20 MEQ PO TBCR
40.0000 meq | EXTENDED_RELEASE_TABLET | Freq: Once | ORAL | Status: AC
  Administered 2020-10-11: 40 meq via ORAL
  Filled 2020-10-11: qty 2

## 2020-10-11 MED ORDER — HEPARIN (PORCINE) IN NACL 1000-0.9 UT/500ML-% IV SOLN
INTRAVENOUS | Status: DC | PRN
Start: 2020-10-11 — End: 2020-10-11
  Administered 2020-10-11: 500 mL

## 2020-10-11 MED ORDER — SODIUM CHLORIDE 0.9 % IV SOLN: INTRAVENOUS | Status: AC

## 2020-10-11 MED ORDER — IOHEXOL 350 MG/ML SOLN
INTRAVENOUS | Status: DC | PRN
  Administered 2020-10-11: 110 mL

## 2020-10-11 MED ORDER — LIDOCAINE HCL (PF) 1 % IJ SOLN
INTRAMUSCULAR | Status: DC | PRN
  Administered 2020-10-11: 20 mL

## 2020-10-11 MED ORDER — ATORVASTATIN CALCIUM 80 MG PO TABS
80.0000 mg | ORAL_TABLET | Freq: Every day | ORAL | Status: DC
  Administered 2020-10-11: 80 mg via ORAL
  Filled 2020-10-11: qty 1

## 2020-10-11 MED FILL — Nitroglycerin IV Soln 100 MCG/ML in D5W: INTRA_ARTERIAL | Qty: 10 | Status: AC

## 2020-10-11 SURGICAL SUPPLY — 18 items
BALLN SAPPHIRE 2.0X12 (BALLOONS) ×2
BALLN SAPPHIRE ~~LOC~~ 3.25X12 (BALLOONS) ×2 IMPLANT
BALLOON SAPPHIRE 2.0X12 (BALLOONS) ×1 IMPLANT
CATH INFINITI JR4 5F (CATHETERS) ×2 IMPLANT
CATH LAUNCHER 6FR EBU 4.5 (CATHETERS) ×2 IMPLANT
CATH VISTA GUIDE 6FR XB4 (CATHETERS) ×2 IMPLANT
CATH VISTA GUIDE 6FR XB4.5 (CATHETERS) ×2 IMPLANT
CLOSURE MYNX CONTROL 6F/7F (Vascular Products) ×2 IMPLANT
KIT ENCORE 26 ADVANTAGE (KITS) ×2 IMPLANT
KIT HEART LEFT (KITS) ×2 IMPLANT
PACK CARDIAC CATHETERIZATION (CUSTOM PROCEDURE TRAY) ×2 IMPLANT
SHEATH PINNACLE 6F 10CM (SHEATH) ×2 IMPLANT
SHEATH PROBE COVER 6X72 (BAG) ×2 IMPLANT
STENT ONYX FRONTIER 3.0X15 (Permanent Stent) ×2 IMPLANT
TRANSDUCER W/STOPCOCK (MISCELLANEOUS) ×2 IMPLANT
TUBING CIL FLEX 10 FLL-RA (TUBING) ×2 IMPLANT
WIRE ASAHI PROWATER 180CM (WIRE) ×2 IMPLANT
WIRE EMERALD 3MM-J .035X150CM (WIRE) ×2 IMPLANT

## 2020-10-11 NOTE — H&P (View-Only) (Signed)
Progress Note  Patient Name: Joseph Wilkerson Date of Encounter: 10/11/2020  [facility] Cardiologist: Quay Burow, MD   Subjective   No chest pain. Having some dyspnea after taking Brilinta.   Inpatient Medications    Scheduled Meds:  aspirin  81 mg Oral Daily   atorvastatin  80 mg Oral Daily   Chlorhexidine Gluconate Cloth  6 each Topical Daily   insulin aspart  0-15 Units Subcutaneous TID WC   insulin aspart  0-5 Units Subcutaneous QHS   metoprolol tartrate  12.5 mg Oral BID   sodium chloride flush  3 mL Intravenous Q12H   sodium chloride flush  3 mL Intravenous Q12H   ticagrelor  90 mg Oral BID   Continuous Infusions:  sodium chloride Stopped (10/10/20 0234)   sodium chloride     sodium chloride     sodium chloride     PRN Meds: sodium chloride, sodium chloride, acetaminophen, morphine injection, ondansetron (ZOFRAN) IV, pneumococcal 23 valent vaccine, sodium chloride flush, sodium chloride flush   Vital Signs    Vitals:   10/11/20 0200 10/11/20 0206 10/11/20 0300 10/11/20 0400  BP:  130/78 107/73 119/64  Pulse: 81  (!) 52 (!) 56  Resp: 20  11 (!) 23  Temp:      TempSrc:      SpO2: 97%  91% 96%  Weight:      Height:        Intake/Output Summary (Last 24 hours) at 10/11/2020 0627 Last data filed at 10/10/2020 2115 Gross per 24 hour  Intake 1447.92 ml  Output 1625 ml  Net -177.08 ml   Last 3 Weights 10/10/2020 10/10/2020 11/12/2018  Weight (lbs) 236 lb 1.8 oz 245 lb 234 lb  Weight (kg) 107.1 kg 111.13 kg 106.142 kg      Telemetry    Sinus, NSVT- Personally Reviewed  ECG     No AM EKG Personally Reviewed  Physical Exam   General: Well developed, well nourished, NAD  HEENT: OP clear, mucus membranes moist  SKIN: warm, dry. No rashes. Neuro: No focal deficits  Musculoskeletal: Muscle strength 5/5 all ext  Psychiatric: Mood and affect normal  Neck: No JVD, no carotid bruits, no thyromegaly, no lymphadenopathy.  Lungs:Clear bilaterally, no  wheezes, rhonci, crackles Cardiovascular: Regular rate and rhythm. No murmurs, gallops or rubs. Abdomen:Soft. Bowel sounds present. Non-tender.  Extremities: No lower extremity edema. Pulses are 2 + in the bilateral DP/PT.  Labs    High Sensitivity Troponin:   Recent Labs  Lab 10/09/20 2330 10/10/20 0225  TROPONINIHS 584* 2,576*      Chemistry Recent Labs  Lab 10/09/20 2330 10/10/20 0048 10/10/20 0225 10/11/20 0047  NA 141 141 139 137  K 4.2 3.6 4.2 3.5  CL 105 106 108 106  CO2 23  --  21* 22  GLUCOSE 150* 138* 117* 109*  BUN '15 15 13 17  ' CREATININE 1.11 0.80 0.94 1.00  CALCIUM 9.9  --  9.0 9.2  GFRNONAA >60  --  >60 >60  ANIONGAP 13  --  10 9     Hematology Recent Labs  Lab 10/09/20 2330 10/10/20 0048 10/10/20 0225 10/11/20 0047  WBC 11.6*  --  11.4* 12.3*  RBC 5.78  --  5.16 4.83  HGB 17.6* 17.0 16.1 14.9  HCT 55.6* 50.0 48.8 45.6  MCV 96.2  --  94.6 94.4  MCH 30.4  --  31.2 30.8  MCHC 31.7  --  33.0 32.7  RDW 13.8  --  13.8 14.1  PLT 308  --  283 290    BNPNo results for input(s): BNP, PROBNP in the last 168 hours.   DDimer No results for input(s): DDIMER in the last 168 hours.   Radiology    CARDIAC CATHETERIZATION  Result Date: 10/10/2020 Formatting of this result is different from the original. Images from the original result were not included.   Prox Cx to Mid Cx lesion is 90% stenosed.   Ost RCA to Prox RCA lesion is 100% stenosed.   A drug-eluting stent was successfully placed using a STENT ONYX FRONTIER 3.0X22.   Post intervention, there is a 0% residual stenosis. Joseph Wilkerson is a 65 y.o. male  [id number] LOCATION:  FACILITY: [place] PHYSICIAN: Quay Burow, M.D. 1955/12/23 DATE OF PROCEDURE:  10/10/2020 DATE OF DISCHARGE: CARDIAC CATHETERIZATION / PCI DES RCA History obtained from chart review.  Joseph Wilkerson is a 65 year old mildly overweight widowed Caucasian male father of 64 children who works as a [employer] manager.  His risk  factors include family history, diabetes, hypertension, hyperlipidemia and ongoing tobacco abuse of 1 pack/day.  He developed chest pain around 830 this evening and was brought to the emergency room where his EKG showed inferolateral ST segment elevation.  He was hemodynamically stable.  He is brought urgently to the Cath Lab for angiography and potential intervention. PROCEDURE DESCRIPTION: The patient was brought to the second floor [facility] cath lab in the postabsorptive state. He was not Premedicated. Marland Kitchen His right wrist and groin Were prepped and shaved in usual sterile fashion. Xylocaine 1% was used for local anesthesia. A 6 French sheath was inserted into the right radial  artery using standard Seldinger technique.  A 6 French sheath was inserted into the right common femoral artery as well.  The patient received 4000 units  of heparin intravenously in the emergency room and additional 2000 prior to angiography.  5 [place] TIG catheter, right Judkins catheter and already 3 catheter were used for selective coronary angiography.  Unfortunately, I was unable to visualize the right coronary artery and had to get right common femoral access.  Right Judkins catheter was then able to identify the occluded RCA.  Isovue dye was used for the entirety of the case (total of 175 cc of contrast administered to the patient)..  Radial cocktail was administered through the SideArm sheath. The patient received a total of 13 thousandths of heparin with an ending ACT of 329.  He received 180 mg of p.o. Brilinta.  Using a 6 [place] JR4 guide catheter along with 0.14 [place] guidewire I was able to cross the proximal occluded RCA lesion with a door to balloon time of 52 minutes.  This was delayed because of tortuosity in the subclavian system and having to get femoral access. There was visible thrombus noted at the site of obstruction.  I did give a bolus of Aggrastat and placed him on a drip.  I predilated with a 2 mm x 12  mm balloon and placed a 3 mm x 22 mm long Medtronic frontier drug-eluting stent deployed at 14 atm.  I postdilated with a 3.25 x 12 mm balloon at 16 atm (3.3 mm) resulting reduction of a thrombotically occluded proximal dominant RCA stenosis to 0% residual with TIMI-3 flow.  There were faint left-to-right collaterals prior to intervention. With restoration of antegrade flow the patient became bradycardic and hypotensive.  He received 1 mg of IV atropine as well as a 500  cc bolus of intravenous fluids which brought his blood pressure up from the 83-33 range systolic to the low 832 range.   Successful RCA PCI drug-eluting stenting in the setting of inferolateral STEMI of a dominant RCA using a 3 mm frontier drug-eluting stent postdilated to 3.3 mm.  The patient did have some visible thrombus which resolved with stent deployment however he did receive Aggrastat bolus and will get a 6-hour infusion.  He does have a residual 80% circumflex stenosis on a bend which may pose some technical challenges.  I suspect that this should probably be approached femorally.  A 2D echocardiogram will be obtained.  He will need to be on dual antiplatelet therapy uninterrupted for 12 months.  Cardiac risk factor modification should be aggressively pursued including smoking cessation, as well as guideline directed optimal medical therapy with high-dose statin therapy.  The right radial sheath was removed and a TR band was placed on the wrist to achieve patent hemostasis.  The femoral sheath was sewn securely in place and will be removed once ACT falls below 170.  The patient left lab in stable condition. Quay Burow. MD, [facility] 10/10/2020 1:42 AM    DG Chest Port 1 View  Result Date: 10/09/2020 CLINICAL DATA:  Sudden onset of chest pain. EXAM: PORTABLE CHEST 1 VIEW COMPARISON:  None. FINDINGS: Mild cardiomegaly. There is aortic tortuosity. Mild peribronchial thickening. No focal airspace disease. No pleural effusion or pneumothorax.  No acute osseous abnormalities are seen. IMPRESSION: 1. Mild cardiomegaly and aortic tortuosity. 2. Mild peribronchial thickening. Electronically Signed   By: Keith Rake M.D.   On: 10/09/2020 23:47   ECHOCARDIOGRAM COMPLETE  Result Date: 10/10/2020    ECHOCARDIOGRAM REPORT   Patient Name:   Joseph Wilkerson Date of Exam: 10/10/2020 Medical Rec #:  [id number]         Height:       71.0 in Accession #:    [id number]        Weight:       236.1 lb Date of Birth:  03-16-1955        BSA:          2.263 m Patient Age:    38 years          BP:           128/84 mmHg Patient Gender: M                 HR:           58 bpm. Exam Location:  Inpatient Procedure: 2D Echo, Color Doppler, Cardiac Doppler and Intracardiac            Opacification Agent Indications:    CAD Native Vessel I25.10  History:        Patient has no prior history of Echocardiogram examinations.                 Risk Factors:Hypertension, Diabetes and Dyslipidemia.  Sonographer:    Bernadene Person RDCS Referring Phys: [place]  1. Left ventricular ejection fraction, by estimation, is 55 to 60%. The left ventricle has normal function. The left ventricle has no regional wall motion abnormalities. There is mild left ventricular hypertrophy. Left ventricular diastolic parameters are consistent with Grade I diastolic dysfunction (impaired relaxation).  2. Right ventricular systolic function is normal. The right ventricular size is normal.  3. The mitral valve is grossly normal. No evidence of mitral valve regurgitation.  4. The aortic valve  is tricuspid. Aortic valve regurgitation is trivial. Mild aortic valve sclerosis is present, with no evidence of aortic valve stenosis.  5. Aortic dilatation noted. There is borderline dilatation of the aortic root, measuring 38 mm. There is mild dilatation of the ascending aorta, measuring 43 mm. Comparison(s): No prior Echocardiogram. FINDINGS  Left Ventricle: Left ventricular ejection  fraction, by estimation, is 55 to 60%. The left ventricle has normal function. The left ventricle has no regional wall motion abnormalities. Definity contrast agent was given IV to delineate the left ventricular  endocardial borders. The left ventricular internal cavity size was normal in size. There is mild left ventricular hypertrophy. Left ventricular diastolic parameters are consistent with Grade I diastolic dysfunction (impaired relaxation). Indeterminate filling pressures. Right Ventricle: The right ventricular size is normal. No increase in right ventricular wall thickness. Right ventricular systolic function is normal. Left Atrium: Left atrial size was normal in size. Right Atrium: Right atrial size was normal in size. Pericardium: There is no evidence of pericardial effusion. Mitral Valve: The mitral valve is grossly normal. No evidence of mitral valve regurgitation. Tricuspid Valve: The tricuspid valve is not well visualized. Tricuspid valve regurgitation is not demonstrated. Aortic Valve: The aortic valve is tricuspid. Aortic valve regurgitation is trivial. Aortic regurgitation PHT measures 444 msec. Mild aortic valve sclerosis is present, with no evidence of aortic valve stenosis. Pulmonic Valve: The pulmonic valve was not well visualized. Pulmonic valve regurgitation is not visualized. Aorta: Aortic dilatation noted. There is borderline dilatation of the aortic root, measuring 38 mm. There is mild dilatation of the ascending aorta, measuring 43 mm. Venous: The inferior vena cava was not well visualized. IAS/Shunts: No atrial level shunt detected by color flow Doppler.  LEFT VENTRICLE PLAX 2D LVIDd:         4.40 cm  Diastology LVIDs:         3.10 cm  LV e' medial:    4.74 cm/s LV PW:         1.20 cm  LV E/e' medial:  8.2 LV IVS:        1.10 cm  LV e' lateral:   6.66 cm/s LVOT diam:     2.30 cm  LV E/e' lateral: 5.9 LV SV:         74 LV SV Index:   33 LVOT Area:     4.15 cm  RIGHT VENTRICLE RV S prime:      11.90 cm/s TAPSE (M-mode): 1.6 cm LEFT ATRIUM             Index       RIGHT ATRIUM           Index LA diam:        4.00 cm 1.77 cm/m  RA Area:     23.50 cm LA Vol (A2C):   36.3 ml 16.04 ml/m RA Volume:   72.10 ml  31.86 ml/m LA Vol (A4C):   33.8 ml 14.94 ml/m LA Biplane Vol: 35.7 ml 15.78 ml/m  AORTIC VALVE LVOT Vmax:   99.40 cm/s LVOT Vmean:  55.300 cm/s LVOT VTI:    0.179 m AI PHT:      444 msec  AORTA Ao Root diam: 3.80 cm Ao Asc diam:  4.30 cm MITRAL VALVE MV Area (PHT): 1.94 cm    SHUNTS MV Decel Time: 391 msec    Systemic VTI:  0.18 m MV E velocity: 39.10 cm/s  Systemic Diam: 2.30 cm MV A velocity: 67.50 cm/s MV E/A ratio:  0.58 Lyman Bishop MD Electronically signed by Lyman Bishop MD Signature Date/Time: 10/10/2020/1:09:12 PM    Final     Cardiac Studies     Patient Profile     65 y.o. male with history of HTN, HLD, DM, sleep apnea and tobacco abuse admitted with inferior STEMI secondary to thrombotic occlusion of the proximal RCA, treated with a drug eluting stent. Residual severe stenosis in the mid Circumflex artery. Echo with LVEF=55-60%.   Assessment & Plan    CAD/inferior STEMI: Pt admitted on the evening of 10/09/20 with an inferior STEMI. The proximal RCA was occluded and was treated with a drug eluting stent. Echo with LVEF=55-60%. No chest pain today.  -Planning staged PCI of the Circumflex today.  -Continue DAPT with ASA and Brilinta. He has some dyspnea that may be related Brilinta. May change to Effient tomorrow.  -Continue high intensity statin.  -Continue low dose beta blocker  NSVT: Continue beta blocker  Acute diastolic CHF: No volume overload on exam. No Lasix today.   For questions or updates, please contact [place] Please consult [email or web address] for contact info under   Signed, Lauree Chandler, MD  10/11/2020, 6:27 AM

## 2020-10-11 NOTE — Research (Signed)
ID- [id number],  [place]  Authorized Site Personnel  '[x]'$   Shirley Muscat, RN '[]'$   Philemon Kingdom, RN  '[]'$   Amy Enid Derry, RCIS  '[]'$   Berneda Rose, RN   SUBJECT NAME: __[name]___ MRN: [id number]   Date of study introduction: ___11-AUG-2022____   (DD-MMM-YYYY)    Time of introduction: __11:30 am_____  (24 hour clock)  During the subject's hospital visit , the authorized site personnel discussed with  with the subject the possibility to participate in the SOS-AMI study, and provided  the subject with the approved Subject Information Leaflet (SIL)-Informed Consent form (ICF) in a language that he/she can understand.  The subject took the document home to reflect on his/her participation in this study before signing it.   '[]'$   The Subject will return to the Cardiovascular Research office, at a later day, to            complete the consenting process.  OR  '[x]'$   The Subject will be given ample time to reflect on this study but will be completing            consent process prior to his/ her discharge from this admission.   **Form based on [employer]ORS[employer] ID-[id number] slide deck_ICF [id number]

## 2020-10-11 NOTE — Research (Signed)
ID- [id number],  SOS-AMI  SUBJECT INFORMED CONSENT PROCESS   Subject Consented by:   '[]'$   Principal Investigator:  Buford Dresser, MD '[x]'$   Sub-Investigator: Bing Quarry, MD  Consenting Process:   '[x]'$   The above named investigator explained the study and SIL-ICF, in their entirety,           to the Subject and Any / All third parties deemed necessary by the subject.  '[x]'$   All medical questions, pertaining to the Study, were asked and fully answered.    '[x]'$   Subject voiced understanding of the Informed Consent contents and is        agreeable to proceed with the SOS-AMI study.   Both the Subject and Investigator signed the SIL-ICF on:    Date of Consent: ____11 Aug 2022 Time of Consent: _____________  14:38 (2:38 pm)  '[x]'$  No study related assessment or testing has been performed prior to the        Subject's ICF signature.   '[x]'$  A signed copy of the SIL-ICF was given to the subject/ legal representative,         as applicable.   Investigator Comments:   Patient was seen and examined with research coordinator in attendance.    Case discussed with Dr. Gwenlyn Found who is in a agreement with patient enrollment in [place] trial.   Trial questions, purpose of study, process of randomization and double blind format, potential benefits and risks of study discussed with patient in detail.   Patient is Killip Class I.   Lungs clear.  Cardiac rhythm regular. No murmurs.    Angiogram reviewed.    Patient consents to participate.    Loretha Brasil. Lia Foyer, MD, [facility], [place] Director, [facility]       **Form based on [facility] [id number] Version5.0-27OCT2021  page 3

## 2020-10-11 NOTE — Progress Notes (Signed)
Chaplain responded to Spiritual Consult requesting delivery of AD. Chaplain explained AD to pt.  Pt's son in the room.  Conversation around [place] brought up a memory about  their wife/mother who had died 30 years ago in a room just across the hall.  Chaplain prayed at beside with pt and son.  [place]

## 2020-10-11 NOTE — Research (Signed)
[id number]          Subject ID: [id number] Trainer's name: Shirley Muscat, RN  Trainer's signature:  on file- Delegation of Authority Log Date of visit:     12-Oct-2020  SOS-AMI ELIGIBILITY:  INCLUSION / EXCLUSION CRITERIA  Inclusion Criteria:  1. Signed and dated informed consent  '[]'$   No   '[x]'$   Yes  61. >/= 64 years old (or age of majority in local region). '[]'$   No  '[x]'$   Yes  3.  Discharged with a confirmed diagnosis of symptomatic         Type 1 AMI within 4 weeks prior to randomization.  '[]'$   No  '[x]'$   Yes  4. Presence of either a second prior AMI within 1 year of screening  '[x]'$  No '[]'$  Yes         Or at least 2 of the following:       A.  Second  prior AMI more than 1 year before screening  '[x]'$   No  '[]'$   Yes     B.  Diabetes Mellitus '[]'$   No  '[x]'$   Yes     C.  Chronic Kidney Disease    '[x]'$   No  '[]'$   Yes     D.  Multivessel Coronary Artery Disease   '[]'$   No  '[x]'$   Yes     E.  Peripheral Artery Disease  '[x]'$   No  '[]'$   Yes     F.  Age >/= 65 years  '[x]'$   No  '[]'$   yes     G. Absence of coronary revascularization of the qualifying AMI.  '[x]'$   No  '[]'$  Yes     H. Active daily smoking at screening '[]'$   No  '[x]'$   Yes  5.  Subject having successfully self-administered placebo during screening. '[]'$   No  '[x]'$   Yes  6.  Women of childbearing potential who fulfill the following criteria: N/A      - Negative pregnancy test (Urine or Serum) at randomization    '[]'$   No  '[]'$   Yes      - Agreement to use an acceptable contraceptive method. '[]'$   No  '[]'$   Yes  Exclusion Criteria:  1.  Increased risk of serious bleeding including any of the following:       A. History of intracranial bleed.  '[x]'$   No  '[]'$   Yes     B. Known uncorrected intracranial vascular abnormality '[x]'$   No  '[]'$   Yes     C. Gastrointestinal bleed requiring hospitalization or transfusion            Within 1 year prior to screening  '[x]'$   No  '[]'$   Yes     D. Subjects on oral triple antithrombotic therapy '[x]'$   No  '[]'$   Yes     E.  Known liver impairment significantly affecting hepatic function '[x]'$   No  '[]'$   Yes     F. Current dialysis  '[x]'$   No  '[]'$   Yes     G. Ischemic stroke or transient ischemic attack within 3 months of screening '[x]'$  No  '[]'$  Yes  2. Chronic anemia with hemoglobin <10 g/dL '[x]'$   No  '[]'$   Yes  3. Chronic thrombocytopenia with platelet count <100,000 /mm3.      '[x]'$   No  '[]'$   Yes  4. Concomitant diseases or conditions that in the opinion of the investigator are  not compatible with study participation.   '[x]'$   No  '[]'$  Yes   5. Known hypersensitivity to selatogrel,m any of its excipients, or drugs           of the P2Y12 class.  '[x]'$   No  '[]'$   Yes  6. Previous exposure to an investigational drug within 3 months            prior to randomization     '[x]'$   No  '[]'$   Yes   7. Participation in another clinical trial with an investigational product     or device within 3 months prior to randomization    '[x]'$   No   '[]'$   Yes  8.  Pregnant, planning to become pregnant, or lactating women  '[x]'$   No  '[]'$   Yes  N/A  9.  Known concomitant life-threatening disease with a                       life expectancy <12 months         '[x]'$   No  '[]'$   Yes   DID SUBJECT MEET ALL THE ELIGIBILITY CRITERIA?  '[]'$  NO   '[x]'$  YES  If NO, please list Inclusion/Exclusion criteria number(s) not met________________    Form based on [employer] source document  eCRF  Version 5.0 - 28-Dec-2019 pg 16                              ID- [id number],  SOS-AMI  Subject ID: [id number] Trainer's name: Shirley Muscat, RN Trainer's signature:  on file- Delegation of Authority Log Date of visit:  12-Oct-2020  SOS-AMI Study: Screening  Date of Visit:  12-Oct-2020  Subject Informed Consent: Date of informed consent:   11-Oct-2020 Time of informed consent: 1438_______ (24 hour clock) Protocol version    __4.0_ Local protocol version (if applicable) [id number]  Demographics: Listed in Epic   Baseline Risk Factors:  Second prior AMI within 1 year  of screening  '[x]'$   No  '[]'$   Yes  Second prior AMI more than 1 year before screening '[x]'$   No  '[]'$   Yes  Diabetes mellitus defined by ongoing glucose lowering treatment '[]'$   No  '[x]'$  Yes  Chronic kidney disease with estimated glomerular filtration               Rate <60 mL/min/1.73 m2                '[x]'$   No  '[]'$   Yes  5.   Multivessel CAD >/= 50% stenosis in at least 2 coronary territories '[]'$   No  '[x]'$   Yes  6.   PAD defined as any of the following: Ankle/brachial index < 0.85             Amputation, peripheral bypass, or peripheral angioplasty            of the extremities secondary to ischemia.   '[x]'$   No   '[]'$   Yes  7.  Absence of coronary revascularization of the qualifying AMI          Referred to in inclusion criterion: '[x]'$  No  '[]'$   Yes  8. Active daily smoking at screening:  '[]'$   No  '[x]'$   Yes      P1. Qualifying AMI Characteristics     1.1  Diagnosis Date    10-Oct-2020     DD-MMM-YYYY      1.2  Diagnosis    '[x]'$  STEMI  '[]'$   NSTEMI  '[]'$   Other           If other, please specify ___________________________________________      1.3  Killip class  '[x]'$  I '[]'$  II '[]'$  III  '[]'$  IV  '[]'$  UNK      1.4  Were revascularization procedures performed?    '[]'$   No  '[x]'$   Yes      1.5  Peak cTn value     _2576____________10-AUG-2022 at 0225     1.6  Type    '[]'$  Troponin T  '[]'$  Troponin I                         '[]'$  High Sensitivity Troponin T    '[x]'$   High Sensitivity Troponin I   '[]'$  Unknown    1.7  Units  '[]'$  g/L   '[x]'$  ng/L  '[]'$  ng/mL  '[]'$  ug/L '[]'$  Unknown                 Other, please specify ______________    1.8 Upper Range Limit   __18_____________     1.9  Date of discharge:  12-Oct-2020     DD-MMM-YYYY  2. Medications at discharge    2.1   Organic Nitrates  '[]'$   No  '[x]'$   Yes    2.2  Beta-blocker   '[]'$   No  '[x]'$   Yes    2.3  ACE inhibitor  '[x]'$   No  '[]'$   Yes    2.4  Angiotensin II receptor blocker  '[]'$   No  '[x]'$   Yes    2.5  Calcium channel blocker  '[x]'$   No  '[]'$   Yes    2.6  Aldosterone receptor blocker  '[x]'$   No   '[]'$   Yes    2.7  Statin   '[]'$   No  '[x]'$   Yes    2.8  PCSK9 inhibitors '[x]'$   No  '[]'$   Yes     Form based on [employer] SOS-AMI_CRF_Version 5.0 - 27OCT2021 pgs 2-5,8

## 2020-10-11 NOTE — Progress Notes (Signed)
Progress Note  Patient Name: Joseph Wilkerson Date of Encounter: 10/11/2020  [facility] Cardiologist: Quay Burow, MD   Subjective   No chest pain. Having some dyspnea after taking Brilinta.   Inpatient Medications    Scheduled Meds:  aspirin  81 mg Oral Daily   atorvastatin  80 mg Oral Daily   Chlorhexidine Gluconate Cloth  6 each Topical Daily   insulin aspart  0-15 Units Subcutaneous TID WC   insulin aspart  0-5 Units Subcutaneous QHS   metoprolol tartrate  12.5 mg Oral BID   sodium chloride flush  3 mL Intravenous Q12H   sodium chloride flush  3 mL Intravenous Q12H   ticagrelor  90 mg Oral BID   Continuous Infusions:  sodium chloride Stopped (10/10/20 0234)   sodium chloride     sodium chloride     sodium chloride     PRN Meds: sodium chloride, sodium chloride, acetaminophen, morphine injection, ondansetron (ZOFRAN) IV, pneumococcal 23 valent vaccine, sodium chloride flush, sodium chloride flush   Vital Signs    Vitals:   10/11/20 0200 10/11/20 0206 10/11/20 0300 10/11/20 0400  BP:  130/78 107/73 119/64  Pulse: 81  (!) 52 (!) 56  Resp: 20  11 (!) 23  Temp:      TempSrc:      SpO2: 97%  91% 96%  Weight:      Height:        Intake/Output Summary (Last 24 hours) at 10/11/2020 0627 Last data filed at 10/10/2020 2115 Gross per 24 hour  Intake 1447.92 ml  Output 1625 ml  Net -177.08 ml   Last 3 Weights 10/10/2020 10/10/2020 11/12/2018  Weight (lbs) 236 lb 1.8 oz 245 lb 234 lb  Weight (kg) 107.1 kg 111.13 kg 106.142 kg      Telemetry    Sinus, NSVT- Personally Reviewed  ECG     No AM EKG Personally Reviewed  Physical Exam   General: Well developed, well nourished, NAD  HEENT: OP clear, mucus membranes moist  SKIN: warm, dry. No rashes. Neuro: No focal deficits  Musculoskeletal: Muscle strength 5/5 all ext  Psychiatric: Mood and affect normal  Neck: No JVD, no carotid bruits, no thyromegaly, no lymphadenopathy.  Lungs:Clear bilaterally, no  wheezes, rhonci, crackles Cardiovascular: Regular rate and rhythm. No murmurs, gallops or rubs. Abdomen:Soft. Bowel sounds present. Non-tender.  Extremities: No lower extremity edema. Pulses are 2 + in the bilateral DP/PT.  Labs    High Sensitivity Troponin:   Recent Labs  Lab 10/09/20 2330 10/10/20 0225  TROPONINIHS 584* 2,576*      Chemistry Recent Labs  Lab 10/09/20 2330 10/10/20 0048 10/10/20 0225 10/11/20 0047  NA 141 141 139 137  K 4.2 3.6 4.2 3.5  CL 105 106 108 106  CO2 23  --  21* 22  GLUCOSE 150* 138* 117* 109*  BUN '15 15 13 17  ' CREATININE 1.11 0.80 0.94 1.00  CALCIUM 9.9  --  9.0 9.2  GFRNONAA >60  --  >60 >60  ANIONGAP 13  --  10 9     Hematology Recent Labs  Lab 10/09/20 2330 10/10/20 0048 10/10/20 0225 10/11/20 0047  WBC 11.6*  --  11.4* 12.3*  RBC 5.78  --  5.16 4.83  HGB 17.6* 17.0 16.1 14.9  HCT 55.6* 50.0 48.8 45.6  MCV 96.2  --  94.6 94.4  MCH 30.4  --  31.2 30.8  MCHC 31.7  --  33.0 32.7  RDW 13.8  --  13.8 14.1  PLT 308  --  283 290    BNPNo results for input(s): BNP, PROBNP in the last 168 hours.   DDimer No results for input(s): DDIMER in the last 168 hours.   Radiology    CARDIAC CATHETERIZATION  Result Date: 10/10/2020 Formatting of this result is different from the original. Images from the original result were not included.   Prox Cx to Mid Cx lesion is 90% stenosed.   Ost RCA to Prox RCA lesion is 100% stenosed.   A drug-eluting stent was successfully placed using a STENT ONYX FRONTIER 3.0X22.   Post intervention, there is a 0% residual stenosis. Joseph Wilkerson is a 65 y.o. male  [id number] LOCATION:  FACILITY: [facility] PHYSICIAN: Quay Burow, M.D. 1955/04/25 DATE OF PROCEDURE:  10/10/2020 DATE OF DISCHARGE: CARDIAC CATHETERIZATION / PCI DES RCA History obtained from chart review.  Joseph Wilkerson is a 65 year old mildly overweight widowed Caucasian male father of 3 children who works as a [employer] manager.  His risk  factors include family history, diabetes, hypertension, hyperlipidemia and ongoing tobacco abuse of 1 pack/day.  He developed chest pain around 830 this evening and was brought to the emergency room where his EKG showed inferolateral ST segment elevation.  He was hemodynamically stable.  He is brought urgently to the Cath Lab for angiography and potential intervention. PROCEDURE DESCRIPTION: The patient was brought to the second floor [place]iac cath lab in the postabsorptive state. He was not Premedicated. Marland Kitchen His right wrist and groin Were prepped and shaved in usual sterile fashion. Xylocaine 1% was used for local anesthesia. A 6 French sheath was inserted into the right radial  artery using standard Seldinger technique.  A 6 French sheath was inserted into the right common femoral artery as well.  The patient received 4000 units  of heparin intravenously in the emergency room and additional 2000 prior to angiography.  5 [place] TIG catheter, right Judkins catheter and already 3 catheter were used for selective coronary angiography.  Unfortunately, I was unable to visualize the right coronary artery and had to get right common femoral access.  Right Judkins catheter was then able to identify the occluded RCA.  Isovue dye was used for the entirety of the case (total of 175 cc of contrast administered to the patient)..  Radial cocktail was administered through the SideArm sheath. The patient received a total of 13 thousandths of heparin with an ending ACT of 329.  He received 180 mg of p.o. Brilinta.  Using a 6 [place] JR4 guide catheter along with 0.14 [place] guidewire I was able to cross the proximal occluded RCA lesion with a door to balloon time of 52 minutes.  This was delayed because of tortuosity in the subclavian system and having to get femoral access. There was visible thrombus noted at the site of obstruction.  I did give a bolus of Aggrastat and placed him on a drip.  I predilated with a 2 mm x 12  mm balloon and placed a 3 mm x 22 mm long Medtronic frontier drug-eluting stent deployed at 14 atm.  I postdilated with a 3.25 x 12 mm balloon at 16 atm (3.3 mm) resulting reduction of a thrombotically occluded proximal dominant RCA stenosis to 0% residual with TIMI-3 flow.  There were faint left-to-right collaterals prior to intervention. With restoration of antegrade flow the patient became bradycardic and hypotensive.  He received 1 mg of IV atropine as well as a 500  cc bolus of intravenous fluids which brought his blood pressure up from the 54-65 range systolic to the low 035 range.   Successful RCA PCI drug-eluting stenting in the setting of inferolateral STEMI of a dominant RCA using a 3 mm frontier drug-eluting stent postdilated to 3.3 mm.  The patient did have some visible thrombus which resolved with stent deployment however he did receive Aggrastat bolus and will get a 6-hour infusion.  He does have a residual 80% circumflex stenosis on a bend which may pose some technical challenges.  I suspect that this should probably be approached femorally.  A 2D echocardiogram will be obtained.  He will need to be on dual antiplatelet therapy uninterrupted for 12 months.  Cardiac risk factor modification should be aggressively pursued including smoking cessation, as well as guideline directed optimal medical therapy with high-dose statin therapy.  The right radial sheath was removed and a TR band was placed on the wrist to achieve patent hemostasis.  The femoral sheath was sewn securely in place and will be removed once ACT falls below 170.  The patient left lab in stable condition. Quay Burow. MD, [facility] 10/10/2020 1:42 AM    DG Chest Port 1 View  Result Date: 10/09/2020 CLINICAL DATA:  Sudden onset of chest pain. EXAM: PORTABLE CHEST 1 VIEW COMPARISON:  None. FINDINGS: Mild cardiomegaly. There is aortic tortuosity. Mild peribronchial thickening. No focal airspace disease. No pleural effusion or pneumothorax.  No acute osseous abnormalities are seen. IMPRESSION: 1. Mild cardiomegaly and aortic tortuosity. 2. Mild peribronchial thickening. Electronically Signed   By: Keith Rake M.D.   On: 10/09/2020 23:47   ECHOCARDIOGRAM COMPLETE  Result Date: 10/10/2020    ECHOCARDIOGRAM REPORT   Patient Name:   RAYMOUND KATICH Date of Exam: 10/10/2020 Medical Rec #:  [id number]         Height:       71.0 in Accession #:    [id number]        Weight:       236.1 lb Date of Birth:  Apr 23, 1955        BSA:          2.263 m Patient Age:    65 years          BP:           128/84 mmHg Patient Gender: M                 HR:           58 bpm. Exam Location:  Inpatient Procedure: 2D Echo, Color Doppler, Cardiac Doppler and Intracardiac            Opacification Agent Indications:    CAD Native Vessel I25.10  History:        Patient has no prior history of Echocardiogram examinations.                 Risk Factors:Hypertension, Diabetes and Dyslipidemia.  Sonographer:    Bernadene Person RDCS Referring Phys: [place]  1. Left ventricular ejection fraction, by estimation, is 55 to 60%. The left ventricle has normal function. The left ventricle has no regional wall motion abnormalities. There is mild left ventricular hypertrophy. Left ventricular diastolic parameters are consistent with Grade I diastolic dysfunction (impaired relaxation).  2. Right ventricular systolic function is normal. The right ventricular size is normal.  3. The mitral valve is grossly normal. No evidence of mitral valve regurgitation.  4. The aortic valve  is tricuspid. Aortic valve regurgitation is trivial. Mild aortic valve sclerosis is present, with no evidence of aortic valve stenosis.  5. Aortic dilatation noted. There is borderline dilatation of the aortic root, measuring 38 mm. There is mild dilatation of the ascending aorta, measuring 43 mm. Comparison(s): No prior Echocardiogram. FINDINGS  Left Ventricle: Left ventricular ejection  fraction, by estimation, is 55 to 60%. The left ventricle has normal function. The left ventricle has no regional wall motion abnormalities. Definity contrast agent was given IV to delineate the left ventricular  endocardial borders. The left ventricular internal cavity size was normal in size. There is mild left ventricular hypertrophy. Left ventricular diastolic parameters are consistent with Grade I diastolic dysfunction (impaired relaxation). Indeterminate filling pressures. Right Ventricle: The right ventricular size is normal. No increase in right ventricular wall thickness. Right ventricular systolic function is normal. Left Atrium: Left atrial size was normal in size. Right Atrium: Right atrial size was normal in size. Pericardium: There is no evidence of pericardial effusion. Mitral Valve: The mitral valve is grossly normal. No evidence of mitral valve regurgitation. Tricuspid Valve: The tricuspid valve is not well visualized. Tricuspid valve regurgitation is not demonstrated. Aortic Valve: The aortic valve is tricuspid. Aortic valve regurgitation is trivial. Aortic regurgitation PHT measures 444 msec. Mild aortic valve sclerosis is present, with no evidence of aortic valve stenosis. Pulmonic Valve: The pulmonic valve was not well visualized. Pulmonic valve regurgitation is not visualized. Aorta: Aortic dilatation noted. There is borderline dilatation of the aortic root, measuring 38 mm. There is mild dilatation of the ascending aorta, measuring 43 mm. Venous: The inferior vena cava was not well visualized. IAS/Shunts: No atrial level shunt detected by color flow Doppler.  LEFT VENTRICLE PLAX 2D LVIDd:         4.40 cm  Diastology LVIDs:         3.10 cm  LV e' medial:    4.74 cm/s LV PW:         1.20 cm  LV E/e' medial:  8.2 LV IVS:        1.10 cm  LV e' lateral:   6.66 cm/s LVOT diam:     2.30 cm  LV E/e' lateral: 5.9 LV SV:         74 LV SV Index:   33 LVOT Area:     4.15 cm  RIGHT VENTRICLE RV S prime:      11.90 cm/s TAPSE (M-mode): 1.6 cm LEFT ATRIUM             Index       RIGHT ATRIUM           Index LA diam:        4.00 cm 1.77 cm/m  RA Area:     23.50 cm LA Vol (A2C):   36.3 ml 16.04 ml/m RA Volume:   72.10 ml  31.86 ml/m LA Vol (A4C):   33.8 ml 14.94 ml/m LA Biplane Vol: 35.7 ml 15.78 ml/m  AORTIC VALVE LVOT Vmax:   99.40 cm/s LVOT Vmean:  55.300 cm/s LVOT VTI:    0.179 m AI PHT:      444 msec  AORTA Ao Root diam: 3.80 cm Ao Asc diam:  4.30 cm MITRAL VALVE MV Area (PHT): 1.94 cm    SHUNTS MV Decel Time: 391 msec    Systemic VTI:  0.18 m MV E velocity: 39.10 cm/s  Systemic Diam: 2.30 cm MV A velocity: 67.50 cm/s MV E/A ratio:  0.58 Lyman Bishop MD Electronically signed by Lyman Bishop MD Signature Date/Time: 10/10/2020/1:09:12 PM    Final     Cardiac Studies     Patient Profile     65 y.o. male with history of HTN, HLD, DM, sleep apnea and tobacco abuse admitted with inferior STEMI secondary to thrombotic occlusion of the proximal RCA, treated with a drug eluting stent. Residual severe stenosis in the mid Circumflex artery. Echo with LVEF=55-60%.   Assessment & Plan    CAD/inferior STEMI: Pt admitted on the evening of 10/09/20 with an inferior STEMI. The proximal RCA was occluded and was treated with a drug eluting stent. Echo with LVEF=55-60%. No chest pain today.  -Planning staged PCI of the Circumflex today.  -Continue DAPT with ASA and Brilinta. He has some dyspnea that may be related Brilinta. May change to Effient tomorrow.  -Continue high intensity statin.  -Continue low dose beta blocker  NSVT: Continue beta blocker  Acute diastolic CHF: No volume overload on exam. No Lasix today.   For questions or updates, please contact [place] Please consult [email or web address] for contact info under   Signed, Lauree Chandler, MD  10/11/2020, 6:27 AM

## 2020-10-11 NOTE — Interval H&P Note (Signed)
Cath Lab Visit (complete for each Cath Lab visit)  Clinical Evaluation Leading to the Procedure:   ACS: Yes.    Non-ACS:    Anginal Classification: CCS III  Anti-ischemic medical therapy: Minimal Therapy (1 class of medications)  Non-Invasive Test Results: No non-invasive testing performed  Prior CABG: No previous CABG      History and Physical Interval Note:  10/11/2020 9:05 AM  Joseph Wilkerson  has presented today for surgery, with the diagnosis of cad.  The various methods of treatment have been discussed with the patient and family. After consideration of risks, benefits and other options for treatment, the patient has consented to  Procedure(s): CORONARY STENT INTERVENTION (N/A) as a surgical intervention.  The patient's history has been reviewed, patient examined, no change in status, stable for surgery.  I have reviewed the patient's chart and labs.  Questions were answered to the patient's satisfaction.     Quay Burow

## 2020-10-12 ENCOUNTER — Encounter: Payer: Self-pay | Admitting: *Deleted

## 2020-10-12 ENCOUNTER — Other Ambulatory Visit (HOSPITAL_COMMUNITY): Payer: Self-pay

## 2020-10-12 DIAGNOSIS — I10 Essential (primary) hypertension: Secondary | ICD-10-CM

## 2020-10-12 LAB — BASIC METABOLIC PANEL
Anion gap: 8 (ref 5–15)
BUN: 16 mg/dL (ref 8–23)
CO2: 20 mmol/L — ABNORMAL LOW (ref 22–32)
Calcium: 8.7 mg/dL — ABNORMAL LOW (ref 8.9–10.3)
Chloride: 106 mmol/L (ref 98–111)
Creatinine, Ser: 0.76 mg/dL (ref 0.61–1.24)
GFR, Estimated: >60 mL/min (ref >60)
Glucose, Bld: 108 mg/dL — ABNORMAL HIGH (ref 70–99)
Potassium: 3.7 mmol/L (ref 3.5–5.1)
Sodium: 134 mmol/L — ABNORMAL LOW (ref 135–145)

## 2020-10-12 LAB — CBC
HCT: 43.4 % (ref 39.0–52.0)
Hemoglobin: 13.9 g/dL (ref 13.0–17.0)
MCH: 30.7 pg (ref 26.0–34.0)
MCHC: 32 g/dL (ref 30.0–36.0)
MCV: 95.8 fL (ref 80.0–100.0)
Platelets: 243 10*3/uL (ref 150–400)
RBC: 4.53 MIL/uL (ref 4.22–5.81)
RDW: 13.9 % (ref 11.5–15.5)
WBC: 10.7 10*3/uL — ABNORMAL HIGH (ref 4.0–10.5)
nRBC: 0 % (ref 0.0–0.2)

## 2020-10-12 LAB — GLUCOSE, CAPILLARY
Glucose-Capillary: 114 mg/dL — ABNORMAL HIGH (ref 70–99)
Glucose-Capillary: 125 mg/dL — ABNORMAL HIGH (ref 70–99)
Glucose-Capillary: 95 mg/dL (ref 70–99)

## 2020-10-12 MED ORDER — IRBESARTAN 150 MG PO TABS
150.0000 mg | ORAL_TABLET | Freq: Every day | ORAL | Status: DC
  Administered 2020-10-12: 150 mg via ORAL
  Filled 2020-10-12: qty 1

## 2020-10-12 MED ORDER — NITROGLYCERIN 0.4 MG SL SUBL
0.4000 mg | SUBLINGUAL_TABLET | SUBLINGUAL | 12 refills | Status: DC | PRN
  Filled 2020-10-12: qty 25, 8d supply, fill #0

## 2020-10-12 MED ORDER — STUDY - SOS-AMI - SELATOGREL 16 MG/0.5 ML OR PLACEBO SQ INJECTION (PI-CHRISTOPHER): 16.0000 mg | INJECTION | SUBCUTANEOUS | Status: DC | PRN

## 2020-10-12 MED ORDER — STUDY - SOS-AMI - SELATOGREL 16 MG/0.5 ML OR PLACEBO SQ INJECTION (PI-CHRISTOPHER)
16.0000 mg | INJECTION | SUBCUTANEOUS | Status: DC | PRN
  Filled 2020-10-12: qty 0.5

## 2020-10-12 MED ORDER — IRBESARTAN 150 MG PO TABS
150.0000 mg | ORAL_TABLET | Freq: Every day | ORAL | 6 refills | Status: DC
  Filled 2020-10-12: qty 30, 30d supply, fill #0

## 2020-10-12 MED ORDER — ATORVASTATIN CALCIUM 80 MG PO TABS
80.0000 mg | ORAL_TABLET | Freq: Every day | ORAL | 3 refills | Status: DC
  Filled 2020-10-12: qty 30, 30d supply, fill #0

## 2020-10-12 MED ORDER — STUDY - SOS-AMI - PLACEBO FOR SELATOGREL 0 MG/0.5 ML SQ INJECTION FOR SCREENING USE ONLY (PI-CHRISTOPHER)
0.5000 mL | INJECTION | Freq: Once | SUBCUTANEOUS | Status: DC
  Filled 2020-10-12: qty 0.5

## 2020-10-12 MED ORDER — TICAGRELOR 90 MG PO TABS
90.0000 mg | ORAL_TABLET | Freq: Two times a day (BID) | ORAL | 11 refills | Status: DC
Start: 2020-10-12 — End: 2021-10-07
  Filled 2020-10-12: qty 60, 30d supply, fill #0

## 2020-10-12 MED ORDER — METOPROLOL TARTRATE 25 MG PO TABS
12.5000 mg | ORAL_TABLET | Freq: Two times a day (BID) | ORAL | 6 refills | Status: DC
  Filled 2020-10-12: qty 30, 30d supply, fill #0
  Filled 2020-10-24: qty 30, 30d supply, fill #1

## 2020-10-12 MED FILL — Nitroglycerin IV Soln 100 MCG/ML in D5W: INTRA_ARTERIAL | Qty: 10 | Status: AC

## 2020-10-12 NOTE — Research (Signed)
[id number]  Subject ID: [id number] Trainer's name: Shirley Muscat, RN  Trainer's signature:  on file- Delegation of Authority Log   SCREENING: PATIENT TRAINING- [place]   Was the training delivered?   '[]'$  NO  '[x]'$  YES        1.1 Date of training:  __12-AUG-2022____   dd/mmm/yyyy     1.2 Start time: ___0830______  24 hour clock  1.3 Stop time: ___0905___  24 hour clock  2. Did the Placebo self-injection occur? '[]'$  NO  '[x]'$   YES  2.1 Autoinjector ID/Label: __[id number]______    Difficulties in using the autoinjector for the DEMONSTRATION DEVICE self-injection:  3. Were there any difficulties in performing the placebo self-injection?  '[x]'$  NO  '[]'$  YES  Difficulties with Step1: Choose an injection site?  3.1.1. Was the injection site as defined in the protocol (abdomen/thigh)? '[]'$  NO  '[]'$   YES 3.1.2 Was the injection done on bare skin? '[]'$  NO  '[]'$   YES 3.1.3 Did the subject report any other difficulties in choosing injection site? '[]'$  NO  '[]'$  YES                 If YES, Please specify: ______________________  Difficulties with Step 2: Twist cap off?  3.2.1  Did the subject twist the cap to remove it? '[]'$  NO  '[]'$   YES 3.2.2    Did the subject twist the cap counterclockwise? '[]'$   NO  '[]'$   YES 3.2.3   Did the force/ torque applied sufficient to twist the cap off?  '[]'$   NO  '[]'$   YES 3.2.4 Did the subject report any other difficulties in twisting the cap off? '[]'$   NO  '[]'$  YES                If YES, please specify:___________________  Difficulties with Step 3: Pinch skin and place the autoinjector:  3.3.1  Did the subject pinch skin at injection site? '[]'$   NO  '[]'$   YES 3.3.2 Did the subject place the autoinjector perpendicular to the skin? '[]'$   NO  '[]'$   YES 3.3.3 Did the subject report any other difficulties pinching the skin and placing                  the autoinejctor?  '[]'$   NO  '[]'$   YES  Difficulties with Step 4: Firmly puch down and hold for 3 seconds  3.4.1 Did the subject  attempt to inject with the autoinjector in the right position,      i.e needle end down?  '[]'$   NO  '[]'$   YES 3.4.2 Did the subject push down firmly until it clicked? '[]'$  NO  '[]'$   YES 3.4.3 Did the subject hold the autoinjector for about 3 seconds or until the        Viewing window turned orange?  '[]'$   NO  '[]'$  YES 3.4.4 Did the subject report any other difficulties pushing firmly down? '[]'$  NO  '[]'$   YES     If YES, please specify: _________________  [employer]   SOS-AMI_CRF_Version 5.0_27OCT2021  pgs 6,7    Patient Training & Self-injection of [place]ACE[place]  1. Was the training delivered?  '[]'$   No  '[x]'$   Yes      1.1  Date of training    12-Oct-2020        [id number]      1.2  Start time       ___0906______ (24 hour clock)      1.3  Stop time  ___0933______ (24 hour clock)       2.  Did the placebo self-injection occur?     '[]'$   No  '[x]'$   Yes      2.1 Autoinjector ID / Label    _[id number]___________________       Difficulties in using the autoinjector for the placebo self-injection    3.  Were there any difficulties in performing the placebo self-injection '[x]'$   No  '[]'$   Yes       IF NO, DO NOT ANSWER THE FOLLOWING QUESTIONS  Difficulties with Step 1: Choose an injection site     3.1.1  Was the injection site as defined in the protocol (abdomen / thigh)?  '[]'$   No  '[]'$   Yes     3.1.2. Was the injection done on bare skin?  '[]'$   No  '[]'$   Yes     3.1.3  Did the subject report any other difficulties choosing                      injection site ?  '[]'$   No  '[]'$   Yes, please specify _____________________  Difficulties with Step 2: Twist cap off     3.2.1  Did the subject twist the cap to remove it?  '[]'$   No  '[]'$   Yes    3.2.2  Did the subject twist the cap counterclockwise? '[]'$   No  '[]'$   Yes    3.2.3  Did the force/ torque applied sufficient to twist the cap off?   '[]'$   No  '[]'$   Yes    3.2.4  Did the subject report any other difficulties in twisting                the cap off?  '[]'$   No  '[]'$   Yes, please specify  ________________________________  Difficulties with Step 3: Pinch skin and place the autoinjector    3.3.1  Did the subject pinch skin at injection site?  '[]'$   No  '[]'$   Yes    3.3.2  Did the subject place the autoinjector perpendicularly to the skin? '[]'$   No  '[]'$   Yes    3.3.3  Did the subject report any other difficulties pinching the skin                and placing the autoinjector?    '[]'$   No  '[]'$   Yes, please specify ____________________  Difficulties with Step 4: Firmly push down and hold for 3 seconds    3.4.1  Did the subject attempt to inject with the autoinjector in the right                position, I.e. needle end down?   '[]'$  No  '[]'$   Yes    3.4.2  Did the subject push down firmly until it clicked? '[]'$   No  '[]'$   Yes    3.4.3  Did the subject hold the autoinjector for about 3 seconds                 or until the viewing window turned orange?  '[]'$   No  '[]'$   Yes    3.4.4  Did the subject report any other difficulties pushing                 firmly down ?  '[]'$   No  '[]'$   Yes, Please specify __________________________    INITIAL PATIENT TRAINING  Q1, Did someone (e.g. caregiver, family member) attend the  training session             together with the patient?    '[]'$  No  '[x]'$   Yes        If yes, please note name phone number and address of other person: _________sonCristie Hem Andrews___________________________  Q2. Was the following information provided to the subject?             '[x]'$   Heart attack symptoms   '[x]'$   How to act (inject and follow-up actions to be taken)  '[x]'$   Use of the demo device, including label instructions and IFU  '[x]'$   How to perform the placebo self-injection   Q3  Did the subject correctly reply to the wrap-up questions?   A.  What are common heart attack symptoms?    '[]'$   No  '[x]'$   Yes             B.  What has to be done in case any of those symptoms occurs?  '[]'$  No  '[x]'$  Yes       Make note of any misconceptions and clarifications  given: ________________________________________________________________________  Q4  Where will the subject keep/store the study autoinjectors? ___in plastic bag_____  ________________________________________________________________________           Form based on [employer] SOS-AMI_CRF Version 5.0 - 27OCT2021 PGS 6, 7, 51

## 2020-10-12 NOTE — Progress Notes (Signed)
Progress Note  Patient Name: Joseph Wilkerson Date of Encounter: 10/12/2020  [facility] Cardiologist: Quay Burow, MD   Subjective   No chest pain or dyspnea.   Inpatient Medications    Scheduled Meds:  aspirin  81 mg Oral Daily   atorvastatin  80 mg Oral Q2000   Chlorhexidine Gluconate Cloth  6 each Topical Daily   insulin aspart  0-15 Units Subcutaneous TID WC   insulin aspart  0-5 Units Subcutaneous QHS   metoprolol tartrate  12.5 mg Oral BID   sodium chloride flush  3 mL Intravenous Q12H   ticagrelor  90 mg Oral BID   Continuous Infusions:  sodium chloride Stopped (10/10/20 0234)   sodium chloride     PRN Meds: sodium chloride, acetaminophen, morphine injection, ondansetron (ZOFRAN) IV, pneumococcal 23 valent vaccine, sodium chloride flush   Vital Signs    Vitals:   10/12/20 0300 10/12/20 0400 10/12/20 0500 10/12/20 0600  BP: 112/80 101/71 101/83 140/72  Pulse: (!) 55 (!) 57 (!) 53 (!) 54  Resp: 18 (!) 22 20 (!) 23  Temp: 97.9 F (36.6 C)   98.3 F (36.8 C)  TempSrc: Oral   Oral  SpO2: 98% 99% 98% 97%  Weight:      Height:        Intake/Output Summary (Last 24 hours) at 10/12/2020 0800 Last data filed at 10/12/2020 0600 Gross per 24 hour  Intake 1098.13 ml  Output 1400 ml  Net -301.87 ml   Last 3 Weights 10/10/2020 10/10/2020 11/12/2018  Weight (lbs) 236 lb 1.8 oz 245 lb 234 lb  Weight (kg) 107.1 kg 111.13 kg 106.142 kg      Telemetry    Sinus- Personally Reviewed  ECG    Sinus, inferior MI- EKG Personally Reviewed  Physical Exam   General: Well developed, well nourished, NAD  HEENT: OP clear, mucus membranes moist  SKIN: warm, dry. No rashes. Neuro: No focal deficits  Musculoskeletal: Muscle strength 5/5 all ext  Psychiatric: Mood and affect normal  Neck: No JVD, no carotid bruits, no thyromegaly, no lymphadenopathy.  Lungs:Clear bilaterally, no wheezes, rhonci, crackles Cardiovascular: Regular rate and rhythm. No murmurs, gallops  or rubs. Abdomen:Soft. Bowel sounds present. Non-tender.  Extremities: No lower extremity edema. Pulses are 2 + in the bilateral DP/PT.  Labs    High Sensitivity Troponin:   Recent Labs  Lab 10/09/20 2330 10/10/20 0225  TROPONINIHS 584* 2,576*      Chemistry Recent Labs  Lab 10/10/20 0225 10/11/20 0047 10/12/20 0037  NA 139 137 134*  K 4.2 3.5 3.7  CL 108 106 106  CO2 21* 22 20*  GLUCOSE 117* 109* 108*  BUN '13 17 16  ' CREATININE 0.94 1.00 0.76  CALCIUM 9.0 9.2 8.7*  GFRNONAA >60 >60 >60  ANIONGAP '10 9 8     ' Hematology Recent Labs  Lab 10/10/20 0225 10/11/20 0047 10/12/20 0037  WBC 11.4* 12.3* 10.7*  RBC 5.16 4.83 4.53  HGB 16.1 14.9 13.9  HCT 48.8 45.6 43.4  MCV 94.6 94.4 95.8  MCH 31.2 30.8 30.7  MCHC 33.0 32.7 32.0  RDW 13.8 14.1 13.9  PLT 283 290 243    BNPNo results for input(s): BNP, PROBNP in the last 168 hours.   DDimer No results for input(s): DDIMER in the last 168 hours.   Radiology    CARDIAC CATHETERIZATION  Result Date: 10/11/2020 Images from the original result were not included.   Prox Cx to Mid Cx lesion is  90% stenosed.   Non-stenotic Ost RCA to Prox RCA lesion was previously treated.   A drug-eluting stent was successfully placed using a STENT ONYX FRONTIER 3.0X15.   Post intervention, there is a 0% residual stenosis. Joseph Wilkerson is a 65 y.o. male  [id number] LOCATION:  FACILITY: [place] PHYSICIAN: Quay Burow, M.D. September 09, 1955 DATE OF PROCEDURE:  10/11/2020 DATE OF DISCHARGE: CARDIAC CATHETERIZATION / PCI DES LCX History obtained from chart review.65 y.o. male with history of HTN, HLD, DM, sleep apnea and tobacco abuse admitted with inferior STEMI secondary to thrombotic occlusion of the proximal RCA, treated with a drug eluting stent. Residual severe stenosis in the mid Circumflex artery. Echo with LVEF=55-60%. PROCEDURE DESCRIPTION: The patient was brought to the second floor [place] Cardiac cath lab in the postabsorptive state. He  was not premedicated. His right groin was prepped and shaved in usual sterile fashion. Xylocaine 1% was used for local anesthesia. A 6 French sheath was inserted into the right common femoral  artery using standard Seldinger technique.  A 5 [place] JR4 diagnostic catheter was used for selective coronary artery angiography of the previously instrumented RCA.  This was demonstrated to be widely patent.  I then focused my attention on the circumflex coronary artery. The patient received 11,000 units of heparin with a beginning ACT of 283 following to 231 at the end of the case.  He received a total of 110 cc of Isovue contrast.  Retrograde aortic pressures monitored during the case.  He had already been on dual antiplatelet therapy including aspirin and Brilinta.  Using a 6 [place] EBU 4.5 cm guide catheter along with a 0.14 [place] guidewire I was able with some difficulty successfully navigate the tube proximal circumflex bands and cross the lesion.  I predilated with a 2 mm x 12 mm balloon and placed a 3 mm x 15 mm long Medtronic frontier drug-eluting stent across the diseased segment deploying at 14 atm.  I then postdilated with a 3.25 x 12 mm noncompliant balloon up to 14 atm (3.3 mm) resulting reduction of a 90% proximal to mid AV groove circumflex stenosis around 2 greater than 90 degree bends to 0% residual.  The patient tolerated the procedure well.  The guidewire and catheter were removed.  A right common femoral angiogram was performed and a MYNX  closure device successfully deployed achieving hemostasis.  The patient left lab stable condition.   Successful staged proximal to mid AV groove circumflex PCI drug-eluting stenting, somewhat technically challenging because of tortuosity, with implantation of a 3 mm x 15 mm long Medtronic frontier drug-eluting stent postdilated to 3.3 mm.  I did demonstrate that the previously placed RCA stent was widely patent.  The patient tolerated procedure well.  There were  no hemodynamic or electrocardiographic sequela.  A MYNX  closure device was successfully deployed.  The patient left lab in stable condition.  Anticipate discharge tomorrow.  He will need dual antiplatelet therapy uninterrupted for at least 12 months. Quay Burow. MD, [facility] 10/11/2020 10:24 AM    ECHOCARDIOGRAM COMPLETE  Result Date: 10/10/2020    ECHOCARDIOGRAM REPORT   Patient Name:   Joseph Wilkerson Date of Exam: 10/10/2020 Medical Rec #:  [id number]         Height:       71.0 in Accession #:    [id number]        Weight:       236.1 lb Date of Birth:  1955/08/17  BSA:          2.263 m Patient Age:    65 years          BP:           128/84 mmHg Patient Gender: M                 HR:           58 bpm. Exam Location:  Inpatient Procedure: 2D Echo, Color Doppler, Cardiac Doppler and Intracardiac            Opacification Agent Indications:    CAD Native Vessel I25.10  History:        Patient has no prior history of Echocardiogram examinations.                 Risk Factors:Hypertension, Diabetes and Dyslipidemia.  Sonographer:    Bernadene Person RDCS Referring Phys: [place]  1. Left ventricular ejection fraction, by estimation, is 55 to 60%. The left ventricle has normal function. The left ventricle has no regional wall motion abnormalities. There is mild left ventricular hypertrophy. Left ventricular diastolic parameters are consistent with Grade I diastolic dysfunction (impaired relaxation).  2. Right ventricular systolic function is normal. The right ventricular size is normal.  3. The mitral valve is grossly normal. No evidence of mitral valve regurgitation.  4. The aortic valve is tricuspid. Aortic valve regurgitation is trivial. Mild aortic valve sclerosis is present, with no evidence of aortic valve stenosis.  5. Aortic dilatation noted. There is borderline dilatation of the aortic root, measuring 38 mm. There is mild dilatation of the ascending aorta, measuring 43 mm.  Comparison(s): No prior Echocardiogram. FINDINGS  Left Ventricle: Left ventricular ejection fraction, by estimation, is 55 to 60%. The left ventricle has normal function. The left ventricle has no regional wall motion abnormalities. Definity contrast agent was given IV to delineate the left ventricular  endocardial borders. The left ventricular internal cavity size was normal in size. There is mild left ventricular hypertrophy. Left ventricular diastolic parameters are consistent with Grade I diastolic dysfunction (impaired relaxation). Indeterminate filling pressures. Right Ventricle: The right ventricular size is normal. No increase in right ventricular wall thickness. Right ventricular systolic function is normal. Left Atrium: Left atrial size was normal in size. Right Atrium: Right atrial size was normal in size. Pericardium: There is no evidence of pericardial effusion. Mitral Valve: The mitral valve is grossly normal. No evidence of mitral valve regurgitation. Tricuspid Valve: The tricuspid valve is not well visualized. Tricuspid valve regurgitation is not demonstrated. Aortic Valve: The aortic valve is tricuspid. Aortic valve regurgitation is trivial. Aortic regurgitation PHT measures 444 msec. Mild aortic valve sclerosis is present, with no evidence of aortic valve stenosis. Pulmonic Valve: The pulmonic valve was not well visualized. Pulmonic valve regurgitation is not visualized. Aorta: Aortic dilatation noted. There is borderline dilatation of the aortic root, measuring 38 mm. There is mild dilatation of the ascending aorta, measuring 43 mm. Venous: The inferior vena cava was not well visualized. IAS/Shunts: No atrial level shunt detected by color flow Doppler.  LEFT VENTRICLE PLAX 2D LVIDd:         4.40 cm  Diastology LVIDs:         3.10 cm  LV e' medial:    4.74 cm/s LV PW:         1.20 cm  LV E/e' medial:  8.2 LV IVS:        1.10 cm  LV e' lateral:   6.66 cm/s LVOT diam:     2.30 cm  LV E/e' lateral:  5.9 LV SV:         74 LV SV Index:   33 LVOT Area:     4.15 cm  RIGHT VENTRICLE RV S prime:     11.90 cm/s TAPSE (M-mode): 1.6 cm LEFT ATRIUM             Index       RIGHT ATRIUM           Index LA diam:        4.00 cm 1.77 cm/m  RA Area:     23.50 cm LA Vol (A2C):   36.3 ml 16.04 ml/m RA Volume:   72.10 ml  31.86 ml/m LA Vol (A4C):   33.8 ml 14.94 ml/m LA Biplane Vol: 35.7 ml 15.78 ml/m  AORTIC VALVE LVOT Vmax:   99.40 cm/s LVOT Vmean:  55.300 cm/s LVOT VTI:    0.179 m AI PHT:      444 msec  AORTA Ao Root diam: 3.80 cm Ao Asc diam:  4.30 cm MITRAL VALVE MV Area (PHT): 1.94 cm    SHUNTS MV Decel Time: 391 msec    Systemic VTI:  0.18 m MV E velocity: 39.10 cm/s  Systemic Diam: 2.30 cm MV A velocity: 67.50 cm/s MV E/A ratio:  0.58 Lyman Bishop MD Electronically signed by Lyman Bishop MD Signature Date/Time: 10/10/2020/1:09:12 PM    Final     Cardiac Studies     Patient Profile     65 y.o. male with history of HTN, HLD, DM, sleep apnea and tobacco abuse admitted with inferior STEMI secondary to thrombotic occlusion of the proximal RCA, treated with a drug eluting stent. Residual severe stenosis in the mid Circumflex artery. Echo with LVEF=55-60%.   Assessment & Plan    CAD/inferior STEMI: Pt admitted on the evening of 10/09/20 with an inferior STEMI. The proximal RCA was occluded and was treated with a drug eluting stent. Echo with LVEF=55-60%. Staged PCI of the Circumflex 10/11/20.  He has no chest pain today.   - Continue DAPT with ASA/Brilinta. No dyspnea after taking Brilinta yesterday -Continue high intensity statin.  -Continue low dose beta blocker  NSVT: Continue beta blocker  Acute diastolic CHF: No volume overload on exam. Dyspnea resolved.   HTN: Resume Avapro at 150 mg po daily  DM: Resume metformin on Sunday at home dose  Discharge home today.   For questions or updates, please contact [place] Please consult [email or web address] for contact info under    Signed, Lauree Chandler, MD  10/12/2020, 8:00 AM

## 2020-10-12 NOTE — Progress Notes (Signed)
CARDIAC REHAB PHASE I   PRE:  Rate/Rhythm: 70 SR    BP: sitting 102/80    SaO2: 97 RA  MODE:  Ambulation: 740 ft   POST:  Rate/Rhythm: 80 SR    BP: sitting 120/61     SaO2: 98 RA  Tolerated well. No c/o. Ed completed including exercise. Referring to [place]. [id number]   [place], ACSM 10/12/2020 8:39 AM

## 2020-10-12 NOTE — Discharge Summary (Addendum)
Discharge Summary    Patient ID: JEYCOB CHARETTE MRN: [id number]; DOB: 10-31-1955  Admit date: 10/09/2020 Discharge date: 10/12/2020  PCP:  Prince Solian, MD   [facility] Providers Cardiologist:  Quay Burow, MD      Discharge Diagnoses    Active Problems:   Acute ST elevation myocardial infarction (STEMI) of inferior wall (HCC)   ST elevation myocardial infarction (STEMI) involving other coronary artery of inferior wall (HCC)  CAD  NSVT  Acute diastolic CHF  HTN  DM  Diagnostic Studies/Procedures    Coronary/Graft Acute MI Revascularization  10/10/09  LEFT HEART CATH AND CORONARY ANGIOGRAPHY   Conclusion      Prox Cx to Mid Cx lesion is 90% stenosed.   Ost RCA to Prox RCA lesion is 100% stenosed.   A drug-eluting stent was successfully placed using a STENT ONYX FRONTIER 3.0X22.   Post intervention, there is a 0% residual stenosis.      IMPRESSION: Successful RCA PCI drug-eluting stenting in the setting of inferolateral STEMI of a dominant RCA using a 3 mm frontier drug-eluting stent postdilated to 3.3 mm.  The patient did have some visible thrombus which resolved with stent deployment however he did receive Aggrastat bolus and will get a 6-hour infusion.  He does have a residual 80% circumflex stenosis on a bend which may pose some technical challenges.  I suspect that this should probably be approached femorally.  A 2D echocardiogram will be obtained.  He will need to be on dual antiplatelet therapy uninterrupted for 12 months.  Cardiac risk factor modification should be aggressively pursued including smoking cessation, as well as guideline directed optimal medical therapy with high-dose statin therapy.  The right radial sheath was removed and a TR band was placed on the wrist to achieve patent hemostasis.  The femoral sheath was sewn securely in place and will be removed once ACT falls below 170.  The patient left lab in stable condition.  CORONARY STENT  INTERVENTION 10/11/20 IMPRESSION: Successful staged proximal to mid AV groove circumflex PCI drug-eluting stenting, somewhat technically challenging because of tortuosity, with implantation of a 3 mm x 15 mm long Medtronic frontier drug-eluting stent postdilated to 3.3 mm.  I did demonstrate that the previously placed RCA stent was widely patent.  The patient tolerated procedure well.  There were no hemodynamic or electrocardiographic sequela.  A MYNX  closure device was successfully deployed.  The patient left lab in stable condition.  Anticipate discharge tomorrow.  He will need dual antiplatelet therapy uninterrupted for at least 12 months.     Echo 10/10/20 1. Left ventricular ejection fraction, by estimation, is 55 to 60%. The  left ventricle has normal function. The left ventricle has no regional  wall motion abnormalities. There is mild left ventricular hypertrophy.  Left ventricular diastolic parameters  are consistent with Grade I diastolic dysfunction (impaired relaxation).   2. Right ventricular systolic function is normal. The right ventricular  size is normal.   3. The mitral valve is grossly normal. No evidence of mitral valve  regurgitation.   4. The aortic valve is tricuspid. Aortic valve regurgitation is trivial.  Mild aortic valve sclerosis is present, with no evidence of aortic valve  stenosis.   5. Aortic dilatation noted. There is borderline dilatation of the aortic  root, measuring 38 mm. There is mild dilatation of the ascending aorta,  measuring 43 mm.   Comparison(s): No prior Echocardiogram.  History of Present Illness     Zoe Honn  Zastoupil is a 65 y.o. male with with history of HTN, HLD, DM, sleep apnea and tobacco abuse admitted with inferior STEMI.  Mr. Panciera presented for sudden onset mid-chest tightness (3/10 on severity) approximately 9pm tonight while watching TV. The pain radiated to his left arm and associated with mild dyspnea, clammy, sweating, and  lightheadedness. He said he had a long-day work at his job earlier. There is no alleviating or precipitating factors he can recall. Denies fever, chills, syncope, cough, heart palpitations, nausea, vomiting, abdominal fullness, dysuria, diarrhea, pedal edema or any bleeding events. EKG showed Inferior STEMI and taken to cath lab.     Reports history of daily smoker (1 pack per day). Denies Fhx of early coronary artery disease. Hospital Course     Consultants: None  Inferior STEMI/CAD - Emergent cath showed occluded proximal RCA s/p PCI with DES. He has residual 80% circumflex stenosis for which he underwent staged PCI with DES next day. No recurrent pain. Echo showed LVEF of 55-60%, no regional WM abnormality. Grade 1 DD. Ambulated well. He has some dyspnea that may be related Brilinta but resolved next day. DAPT with ASA and Brillinta. Ambulated well. Added statin and BB.   2. HLD - 10/09/2020: Cholesterol 198; HDL 34; LDL Cholesterol 120; Triglycerides 222; VLDL 44  - Continue high intensity statin - Lipid panel and LFTS in 6-8 weeks   3. HTN - BP stable and controlled on BB and ARB  4. Acute diastolic CHF - Had dyspnea. ? Due brillinta but resolved next day. Treated with one dose of IV lasix with resolution of symptoms. Felt euvolemic.   5. NSVT - Short runs of NSVT overnight post emergent PCI. Resolved with addition of BB and PCI.    6. DM - Resume metformin on Sunday at home dose    Did the patient have an acute coronary syndrome (MI, NSTEMI, STEMI, etc) this admission?:  Yes                               AHA/ACC Clinical Performance & Quality Measures: Aspirin prescribed? - Yes ADP Receptor Inhibitor (Plavix/Clopidogrel, Brilinta/Ticagrelor or Effient/Prasugrel) prescribed (includes medically managed patients)? - Yes Beta Blocker prescribed? - Yes High Intensity Statin (Lipitor 40-'80mg'$  or Crestor 20-'40mg'$ ) prescribed? - Yes EF assessed during THIS hospitalization? - Yes For EF  <40%, was ACEI/ARB prescribed? - Yes For EF <40%, Aldosterone Antagonist (Spironolactone or Eplerenone) prescribed? - Not Applicable (EF >/= [id number]) Cardiac Rehab Phase II ordered (including medically managed patients)? - Yes     Discharge Vitals Blood pressure 123/71, pulse 75, temperature 98.3 F (36.8 C), temperature source Oral, resp. rate (!) 23, height '5\' 11"'$  (1.803 m), weight 107.1 kg, SpO2 97 %.  Filed Weights   10/10/20 0000 10/10/20 0206  Weight: 111.1 kg 107.1 kg    Labs & Radiologic Studies    CBC Recent Labs    10/11/20 0047 10/12/20 0037  WBC 12.3* 10.7*  HGB 14.9 13.9  HCT 45.6 43.4  MCV 94.4 95.8  PLT 290 0000000   Basic Metabolic Panel Recent Labs    10/11/20 0047 10/12/20 0037  NA 137 134*  K 3.5 3.7  CL 106 106  CO2 22 20*  GLUCOSE 109* 108*  BUN 17 16  CREATININE 1.00 0.76  CALCIUM 9.2 8.7*   High Sensitivity Troponin:   Recent Labs  Lab 10/09/20 2330 10/10/20 0225  TROPONINIHS 584* 2,576*     Hemoglobin  A1C Recent Labs    10/09/20 2357  HGBA1C 6.3*   Fasting Lipid Panel Recent Labs    10/09/20 2357  CHOL 198  HDL 34*  LDLCALC 120*  TRIG 222*  CHOLHDL 5.8    Disposition   Pt is being discharged home today in good condition.  Follow-up Plans & Appointments     Follow-up Information     Warren Lacy, PA-C Follow up on 11/01/2020.   Specialty: Internal Medicine Why: '@10'$ :30am for hospital follow up. Please arrive 15 minutes early Contact information: [address] [place] [address] [phone]                Discharge Instructions     AMB Referral to Cardiac Rehabilitation - Phase II   Complete by: As directed    Diagnosis: STEMI   After initial evaluation and assessments completed: Virtual Based Care may be provided alone or in conjunction with Phase 2 Cardiac Rehab based on patient barriers.: Yes   Amb Referral to Cardiac Rehabilitation   Complete by: As directed    Diagnosis:  Coronary  Stents STEMI PTCA     After initial evaluation and assessments completed: Virtual Based Care may be provided alone or in conjunction with Phase 2 Cardiac Rehab based on patient barriers.: Yes   Diet - low sodium heart healthy   Complete by: As directed    Discharge instructions   Complete by: As directed    No driving for 2 weeks. No lifting over 10 lbs for 4 weeks. No sexual activity for 4 weeks. You may not return to work until cleared by your cardiologist. Keep procedure site clean & dry. If you notice increased pain, swelling, bleeding or pus, call/return!  You may shower, but no soaking baths/hot tubs/pools for 1 week.   Increase activity slowly   Complete by: As directed        Discharge Medications   Allergies as of 10/12/2020   No Known Allergies      Medication List     STOP taking these medications    ibuprofen 200 MG tablet Commonly known as: ADVIL   pravastatin 40 MG tablet Commonly known as: PRAVACHOL       TAKE these medications    aspirin EC 81 MG tablet Take 81 mg by mouth daily.   atorvastatin 80 MG tablet Commonly known as: LIPITOR Take 1 tablet (80 mg total) by mouth daily at 8 pm.   fluticasone 50 MCG/ACT nasal spray Commonly known as: FLONASE Place 2 sprays into both nostrils daily as needed for allergies.   irbesartan 150 MG tablet Commonly known as: AVAPRO Take 1 tablet (150 mg total) by mouth daily. What changed:  medication strength how much to take   Meclizine HCl 25 MG Chew Chew 25 mg by mouth every 6 (six) hours as needed for dizziness.   metFORMIN 500 MG 24 hr tablet Commonly known as: GLUCOPHAGE-XR Take 1,000 mg by mouth 2 (two) times daily with a meal.   metoprolol tartrate 25 MG tablet Commonly known as: LOPRESSOR Take 0.5 tablets (12.5 mg total) by mouth 2 (two) times daily.   nitroGLYCERIN 0.4 MG SL tablet Commonly known as: Nitrostat Place 1 tablet (0.4 mg total) under the tongue every 5 (five) minutes as  needed.   OneTouch Verio test strip Generic drug: glucose blood USE 1 STRIP TO CHECK GLUCOSE TWICE DAILY   sertraline 50 MG tablet Commonly known as: ZOLOFT Take 50 mg by mouth daily.  SOS-AMI selatogrel or placebo 16 mg/0.5 mL Inj SQ injection Inject 0.5 mLs (16 mg total) into the skin as needed (if experiencing symptoms of a heart attack).   Steglatro 5 MG Tabs tablet Generic drug: ertugliflozin L-PyroglutamicAc Take 5 mg by mouth daily.   ticagrelor 90 MG Tabs tablet Commonly known as: BRILINTA Take 1 tablet (90 mg total) by mouth 2 (two) times daily.         Outstanding Labs/Studies   Consider OP f/u labs 6-8 weeks given statin initiation this admission.   Duration of Discharge Encounter   Greater than 30 minutes including physician time.  Jarrett Soho, PA 10/12/2020, 10:54 AM

## 2020-10-12 NOTE — Discharge Instructions (Signed)

## 2020-10-12 NOTE — Research (Signed)
Lina Sar Subject                 [id number], Lorre Nick   Subject ID: [id number] Trainer's name: Shirley Muscat, RN  Trainer's signature:  on file- Delegation of Authority Log Date of visit:  12-Oct-2020       DDMMMYYY  Enrollment:   1.1 Screening Date    12-Oct-2020  1.2 Sex Male '[]'$     Male   '[x]'$   1.3    Age at screening  ___64__ years  Was Subject randomized:   '[x]'$  Yes  '[]'$  No        If No, select reason:   '[]'$  Not eligible as per inclusion/exclusion criteria     '[]'$  Withdrawal by subject     '[]'$   Withdrawal by parent / guardian     '[]'$  Adverse event      '[]'$  Lost to follow-up     '[]'$  Death     '[]'$  Other: ___________________    2.  Randomization:   2.1 Randomization Date 12-Oct-2020  2.2 Randomization Number _[id number]____  2.3 Stratification 1     '[]'$   Background oral P2Y12 receptor antagonist: None    '[]'$   Background oral P2Y12 receptor antagonist: Clopidogrel    '[]'$   Background oral P2Y12 receptor antagonist: Prasugrel    '[x]'$   Background oral P2Y12 receptor antagonist: Ticagrelor  3. Screening Failure:  3.1  Screen Failure Date N/A DD/MM/YYYY  4. Discontinuation   4.1   Subject's discontinuation date   N/A DD/MM/YYYY   Form based on IDORSIA [id number],protocol  Version 4.0  page 56 - 22 May 2019 And SOS-AMI_CRFversion 5.0_27Oct2021 pages 1 and 17.                       [id number]   Subject ID: [id number] Trainer's name: Shirley Muscat, RN Trainer's signature:  on file- Delegation of Authority Log Date of visit:   12-Oct-2020  SOS-AMI   VISIT 1   1. Were body weight and/or height asessed?   '[]'$  No   '[x]'$   Yes  2. Date  __12-AUG-2022_   3. Height    _71_____          4.  Units     '[]'$   cm    '[x]'$   inches  5. Height (cm calculated)  inches x 2.54 __180.34_  cm  6. Weight   __236__          7.  Units     '[]'$   kg     '[x]'$   lbs  8. Weight (kg calculated)    weight lbs x 0.4536   __107.1__ kg  9. BMI (kg/m2)  __32.93___   Physical examination  Was the  physical examination performed?     '[]'$   No  '[x]'$   Yes  2.  Date     _12-AUG-2022  Medical History  1 Any clinically significant past and/or concomitant diseases or past procedures?  '[]'$  No  '[]'$  Yes     Main disease or procedure        Start date         End date      Ongoing at          Informed consent ?  Left Heart catheterization     10-Oct-2020 10-Oct-2020        yes   Stent intervention DES x1   10-Oct-2020   10-Oct-2020        yes  Left heart catheterization    11-Oct-2020     11-Oct-2020        yes    Stent intervention DES x 1    11-Oct-2020     11-Oct-2020        yes       Add more lines as needed.   Most recent LVEF & Procedures Qualifying AMI  Most recent LVEF      1.1  Assessment date    _10-AUG-2022       1.2  Left Ventricular Ejection Fraction (%) '[]'$   < 35   '[]'$   35-50  '[x]'$   >50  2.  Procedures Qualifying AMI      2.1  Coronary- angiography   '[]'$   No  '[x]'$   Yes      2.2  Percutaneous coronary intervention '[]'$   No  '[x]'$   Yes      2.3  Coronary Artery Bypass Grafting  '[x]'$   No  '[]'$   Yes      2.4  Stent insertion    '[]'$   No  '[x]'$   Yes              2.4.1.   BMS?       '[]'$  No  '[]'$   yes, Number of BMS  ___________              2.4.2.  DES?        '[]'$   No  '[x]'$   yes, Number of DES  ___2_______   2.4.3 BVS?      '[]'$   No  '[]'$   yes, Number of BVS  ___________  Vital Signs  Were vital signs collected?  '[]'$   No   '[x]'$   Yes  1.1  Date      _12-AUG-2022  1.2  Time     _0800_ 24 hour clock  1.3  Heart Rate (bpm)  _72______               1.4  Systolic Blood Pressure (mmHg) ___102____             1.5  Diastolic Blood Pressure (mmHg)  80__  Pregnancy Test  2. Was a pregnancy test performed?  '[]'$   No  '[]'$   yes  '[x]'$   N/A     2.1  Test Date   ___________ DD- MMM-YYY   FRAILTY CHARACTERISTICS   Did any of the following occure within the last 12 months?  4.1  Unintentional weight loss greater than or equal to 5 kg(10 lbs)    '[x]'$  No  '[]'$  Yes 4.2  Decrease grip strength '[x]'$  No  '[]'$   Yes 4.3 Increased fatigue/ lethargy or decrease in endurance   '[x]'$  No  '[]'$   Yes 4.4   Decrease in pace when walking a distance of 5 meters (15 feet) '[x]'$   No  '[]'$   Yes 4.5 Decline in typical physical activity level  '[x]'$   No  '[]'$   Yes  Form based on [employer] eCRF  Version 5.0 - 28-Dec-2019 pg 10-15  .sos

## 2020-10-15 NOTE — Research (Unsigned)
Patient was discharged at 1249. He had both of his autoinjectors with him [phone] and [phone]). Both patient and son voiced understanding of the SOS-AMI information and had no questions.

## 2020-10-18 ENCOUNTER — Other Ambulatory Visit (HOSPITAL_COMMUNITY): Payer: Self-pay

## 2020-10-18 ENCOUNTER — Telehealth (HOSPITAL_COMMUNITY): Payer: Self-pay

## 2020-10-18 NOTE — Telephone Encounter (Signed)
Transitions of Care Pharmacy  ° °Call attempted for a pharmacy transitions of care follow-up. HIPAA appropriate voicemail was left with call back information provided.  ° °Call attempt #1. Will follow-up in 2-3 days.  °  °

## 2020-10-19 ENCOUNTER — Telehealth (HOSPITAL_COMMUNITY): Payer: Self-pay

## 2020-10-19 NOTE — Telephone Encounter (Signed)
Transitions of Care Pharmacy   Call attempted for a pharmacy transitions of care follow-up. HIPAA appropriate voicemail was left with call back information provided.   Call attempt #2. Will follow-up in 2-3 days.    

## 2020-10-22 ENCOUNTER — Telehealth (HOSPITAL_COMMUNITY): Payer: Self-pay

## 2020-10-22 NOTE — Telephone Encounter (Signed)
Pt insurance is active and benefits verified through [facility], DED $1,500/$1,500 met, out of pocket $3,000/$3,000 met, co-insurance 20%. no pre-authorization required. Passport, 10/22/2020'@2' :57pm, REF#    Will contact patient to see if he is interested in the Cardiac Rehab Program. If interested, patient will need to complete follow up appt. Once completed, patient will be contacted for scheduling upon review by the RN Navigator.

## 2020-10-22 NOTE — Telephone Encounter (Signed)
Transitions of Care Pharmacy   Call attempted for a pharmacy transitions of care follow-up. HIPAA appropriate voicemail was left with call back information provided.   Call attempt #3. Will no longer attempt follow up for TOC pharmacy.   

## 2020-10-24 ENCOUNTER — Other Ambulatory Visit (HOSPITAL_COMMUNITY): Payer: Self-pay

## 2020-10-25 ENCOUNTER — Encounter: Payer: Self-pay | Admitting: *Deleted

## 2020-10-25 DIAGNOSIS — Z006 Encounter for examination for normal comparison and control in clinical research program: Secondary | ICD-10-CM

## 2020-10-25 NOTE — Research (Signed)
[id number]     FOLLOW-UP PHONE CALLS  SUBJECT ID:  [id number] Trainer's name: Shirley Muscat, RN Trainer's signature: on Delegation of Authority Log Date of the Follow up call:   30-Oct-2020         Visit Number: 2 Start time of the Follow up call:  1605           End time of call: 36  [name] is doing well. He saw his PCP, Dr. Dagmar Hait yesterday and was given Rx/samples of Chantix to start. Patient is very hopeful that he will stop smoking because " he doesn't want to have another heart attack and be in the hospital". He has an appt for his postop with Dr. Kennon Holter office this Thursday, 11-01-20. He reports no CP since he left the hospital and has had not AEs, SAEs or urgent care visits since his discharge. I asked him questions about the SOS-AMI autoinjector and he answered all of them appropriately. Mr. Hochman and his family have all my contact information and they are aware that they can call me anytime day or night if they have questions or concerns.   - CONTACT  Q1;  Was the follow up phone call done with the subject?  '[x]'$   YES  '[]'$   NO           If NO, check the following:      Q2:    Was the follow up phone call done with a family member               Or caregiver?    '[]'$   YES   '[x]'$   NO          Make note about reasons ___at work__________________________    - MEDICAL CONDITION      Q3:  Did any of the following occur?   '[]'$   Death       '[]'$   Hospitalization (any cause)     NONE    '[]'$   Use of autoinjector  Make note about the type of event, and when it occurred. In case of hospitalization: the Location of the hospital and/or the treating physician's contact details  ____________ ____________________________________________________________________ ____________________________________________________________________  Note: remember to report any SAE/AE which has occurred within 30 days after any  injection of the study drug on relevant eCRF  forms.   Q4:  Did the subject develop any condition which is an          exclusion criterion?  '[]'$   YES  '[x]'$   NO   Take note about the occurrence of any exclusion criterion after randomization: _________________________________________________________________ __________________________________________________________________  Q5: Was there any change in subject's antithrombotic therapy?   '[]'$   YES  '[x]'$   NO     Take not about any change of antithrombotic treatment: _______________________ ____________________________________________________________________ ____________________________________________________________________      Lezlie Octave OF THE STUDY-SPECFIC TRAINING  Q6: Did the subject correctly reply to the following questions?       A.  What are common heart attack symptoms?      '[x]'$   YES   '[]'$   NO      B.  What has to be done in case any of those symptoms occurs? '[x]'$   YES  '[]'$   NO      C.  What are the main steps to perform a self-injection?  '[x]'$   YES  '[]'$   NO             If NO, then report which  step/s was/were missing:        '[]'$   Choose injection site (abdomen or thigh)       '[]'$   Twist cap off       '[]'$   Pinch skin and place the study autoinjector       '[]'$   Firmly push down and hold for 3 seconds       D. What has to be done immediately after an injection?  '[x]'$   YES   '[]'$   NO          If NO, then report which step/s was/were missing:       '[]'$   Call  for emergency medical help       '[]'$   Show the autoinjector to the emergency medical responder       E.  Does the subject recall where s/he keeps/ stores the autoinjectors? '[x]'$  YES  '[]'$  NO          Note the place of storage and any corrective explanation if needed below __________________________________________________________________ __________________________________________________________________  - TRAINING REFRESHER   Q7:  Is a training refresher needed?      '[]'$  YES  '[x]'$  NO        If YES, indicate items that have to be  refreshed. More than one may apply:               '[]'$   Heart attack symptoms             '[]'$   Actions to be taken following heart attack symptoms             '[]'$   Steps to perform the self-injection and follow-up actions to be taken   '[]'$   Other, Specify  _________________________________________     Form Based on IDORSIA SOS-AMI_CRF_Version 5.0_27Oct2021 pgs18-48, 62,            and eCRF  Version 5.0-  28-Dec-2019   Current Outpatient Medications:    aspirin EC 81 MG tablet, Take 81 mg by mouth daily., Disp: , Rfl:    atorvastatin (LIPITOR) 80 MG tablet, Take 1 tablet (80 mg total) by mouth daily at 8 pm., Disp: 90 tablet, Rfl: 3   fluticasone (FLONASE) 50 MCG/ACT nasal spray, Place 2 sprays into both nostrils daily as needed for allergies., Disp: , Rfl:    irbesartan (AVAPRO) 150 MG tablet, Take 1 tablet (150 mg total) by mouth daily., Disp: 30 tablet, Rfl: 6   Meclizine HCl 25 MG CHEW, Chew 25 mg by mouth every 6 (six) hours as needed for dizziness., Disp: , Rfl:    metFORMIN (GLUCOPHAGE-XR) 500 MG 24 hr tablet, Take 1,000 mg by mouth 2 (two) times daily with a meal., Disp: , Rfl:    metoprolol tartrate (LOPRESSOR) 25 MG tablet, Take 0.5 tablets (12.5 mg total) by mouth 2 (two) times daily., Disp: 30 tablet, Rfl: 6   nitroGLYCERIN (NITROSTAT) 0.4 MG SL tablet, Place 1 tablet (0.4 mg total) under the tongue every 5 (five) minutes as needed., Disp: 25 tablet, Rfl: 12   ONETOUCH VERIO test strip, USE 1 STRIP TO CHECK GLUCOSE TWICE DAILY, Disp: , Rfl:    sertraline (ZOLOFT) 50 MG tablet, Take 50 mg by mouth daily., Disp: , Rfl:    STEGLATRO 5 MG TABS, Take 5 mg by mouth daily., Disp: , Rfl:    Study - SOS-AMI - selatogrel 16 mg/0.5 mL or placebo SQ injection (PI-Christopher), Inject 0.5 mLs (16 mg total) into the skin as needed (if experiencing symptoms  of a heart attack)., Disp: , Rfl:    ticagrelor (BRILINTA) 90 MG TABS tablet, Take 1 tablet (90 mg total) by mouth 2 (two) times daily., Disp: 60  tablet, Rfl: 11

## 2020-10-30 ENCOUNTER — Other Ambulatory Visit (HOSPITAL_COMMUNITY): Payer: Self-pay

## 2020-10-31 NOTE — Progress Notes (Deleted)
Cardiology Office Note:    Date:  10/31/2020   ID:  Joseph Wilkerson, DOB [birth year], MRN [id number]  PCP:  Prince Solian, MD Referring MD: Prince Solian, [place]  Cardiologist:  Quay Burow, MD   Reason for visit: Hospital follow-up  History of Present Illness:    Joseph Wilkerson is a 65 y.o. male with a hx of HTN, HLD, DM, sleep apnea and tobacco abuse admitted 10/09/20 with inferior STEMI.  Joseph Wilkerson presented with sudden onset mid-chest tightness while watching TV. The pain radiated to his left arm and associated with mild dyspnea, clammy, sweating, and lightheadedness.  Emergent cath showed occluded proximal RCA s/p PCI with DES. He had residual 80% circumflex stenosis for which he underwent staged PCI with DES next day.  Echo showed LVEF of 55-60%, no regional WM abnormality. Grade 1 DD.  Short runs of NSVT overnight post emergent PCI. Resolved with addition of BB and PCI.  Had SOB following PCI.  ?Due brillinta but resolved next day. Treated with one dose of IV lasix with resolution of symptoms.  Patient followed up with his PCP who prescribed him Chantix to aid tobacco cessation.  Today, ***  Inferior STEMI/CAD ***  ? Referral to Avelino Leeds, care guide for health goals, tobacco cessation, nutrition, stress management and sleep hygiene. ? Referral to P.R.E.P. Deloria Lair, the [employer] and will begin in [place] in October. The PREP program is a 12 week exercise and lifestyle management program led by an RN who works out the [employer].  This RN is employed by [facility]- however ALL patients are welcome in this program.  [facility] will cover the $100 cost of the program for [facility] patients (we will also find money for non-[facility] patients when necessary).  If you have a patient that needs exercise, nutrition, lifestyle change or just socialization- PLEASE REFER!  Easiest way to refer is to email  [email or web address] or call any of the numbers on the  flyer  Hypertension - *** - Goal BP is <130/80.  Recommend DASH diet (high in vegetables, fruits, low-fat dairy products, whole grains, poultry, fish, and nuts and low in sweets, sugar-sweetened beverages, and red meats), salt restriction and increase physical activity.  Hyperlipidemia - 10/09/2020: Cholesterol 198; HDL 34; LDL Cholesterol 120; Triglycerides 222; VLDL 44  - *** - *** Lipid panel and LFTS in 6-8 weeks  - Discussed cholesterol lowering diets - Mediterranean diet, DASH diet, vegetarian diet, low-carbohydrate diet and avoidance of trans fats.  Discussed healthier choice substitutes.  Nuts, high-fiber foods, and fiber supplements may also improve lipids.    Obesity - Discussed how even a 5-10% weight loss can have cardiovascular benefits.   - Recommend moderate intensity activity for 30 minutes 5 days/week and the DASH diet.  Tobacco use  - Recommend tobacco cessation.  Reviewed physiologic effects of nicotine and the immediate-eventual benefits of quitting including improvement in cough/breathing and reduction in cardiovascular events.  Discussed quitting tips such as removing triggers and getting support from family/friends and Quitline [place].    Disposition - Follow-up in ***    Past Medical History:  Diagnosis Date   Allergic rhinitis    Allergy    Arthritis    Depression    Diabetes (HCC)    GERD (gastroesophageal reflux disease)    Hemorrhoids    HTN (hypertension)    Hyperlipidemia    Nephrolithiasis    OSA (obstructive sleep apnea)    Sleep apnea  Past Surgical History:  Procedure Laterality Date   COLONOSCOPY     CORONARY STENT INTERVENTION N/A 10/11/2020   Procedure: CORONARY STENT INTERVENTION;  Surgeon: Lorretta Harp, MD;  Location: [place] [facility];  Service: Cardiovascular;  Laterality: N/A;   CORONARY/GRAFT ACUTE MI REVASCULARIZATION N/A 10/10/2020   Procedure: Coronary/Graft Acute MI Revascularization;  Surgeon: Lorretta Harp, MD;   Location: [place] [facility];  Service: Cardiovascular;  Laterality: N/A;   KYPHOPLASTY  06/18/2012   sec L Comp Fx   LEFT HEART CATH AND CORONARY ANGIOGRAPHY N/A 10/10/2020   Procedure: LEFT HEART CATH AND CORONARY ANGIOGRAPHY;  Surgeon: Lorretta Harp, MD;  Location: [place] [facility];  Service: Cardiovascular;  Laterality: N/A;    Current Medications: No outpatient medications have been marked as taking for the 11/01/20 encounter (Appointment) with Warren Lacy, PA-C.     Allergies:   Patient has no known allergies.   Social History   Socioeconomic History   Marital status: Widowed    Spouse name: Not on file   Number of children: 3   Years of education: Not on file   Highest education level: Not on file  Occupational History   Occupation: Hospitality    Employer: [employer]  Tobacco Use   Smoking status: Every Day    Packs/day: 1.00    Types: Cigarettes   Smokeless tobacco: Never  Vaping Use   Vaping Use: Never used  Substance and Sexual Activity   Alcohol use: Yes    Comment: Socially-once per month   Drug use: No   Sexual activity: Not on file  Other Topics Concern   Not on file  Social History Narrative   Not on file   Social Determinants of Health   Financial Resource Strain: Not on file  Food Insecurity: Not on file  Transportation Needs: Not on file  Physical Activity: Not on file  Stress: Not on file  Social Connections: Not on file     Family History: The patient's family history includes CAD in his brother and father; Cancer in his mother; Diabetes in his brother; Heart attack in his father. There is no history of Colon cancer, Pancreatic cancer, Rectal cancer, Stomach cancer, or Esophageal cancer.  ROS:   Please see the history of present illness.     EKGs/Labs/Other Studies Reviewed:    EKG:  The ekg ordered today demonstrates ***  Recent Labs: 10/12/2020: BUN 16; Creatinine, Ser 0.76; Hemoglobin 13.9; Platelets 243; Potassium 3.7;  Sodium 134  Recent Lipid Panel    Component Value Date/Time   CHOL 198 10/09/2020 2357   TRIG 222 (H) 10/09/2020 2357   HDL 34 (L) 10/09/2020 2357   CHOLHDL 5.8 10/09/2020 2357   VLDL 44 (H) 10/09/2020 2357   LDLCALC 120 (H) 10/09/2020 2357    Physical Exam:    VS:  There were no vitals taken for this visit.    Wt Readings from Last 3 Encounters:  10/10/20 236 lb 1.8 oz (107.1 kg)  11/12/18 234 lb (106.1 kg)  11/04/18 234 lb 12.8 oz (106.5 kg)     GEN: *** Well nourished, well developed in no acute distress HEENT: Normal NECK: No JVD; No carotid bruits CARDIAC: ***RRR, no murmurs, rubs, gallops RESPIRATORY:  Clear to auscultation without rales, wheezing or rhonchi  ABDOMEN: Soft, non-tender, non-distended MUSCULOSKELETAL: No edema; No deformity  SKIN: Warm and dry NEUROLOGIC:  Alert and oriented PSYCHIATRIC:  Normal affect   ASSESSMENT AND PLAN   ***   {  Are you ordering a CV Procedure (e.g. stress test, cath, DCCV, TEE, etc)?   Press F2        :[id number]    Medication Adjustments/Labs and Tests Ordered: Current medicines are reviewed at length with the patient today.  Concerns regarding medicines are outlined above.  No orders of the defined types were placed in this encounter.  No orders of the defined types were placed in this encounter.   There are no Patient Instructions on file for this visit.   Signed, Warren Lacy, PA-C  10/31/2020 9:57 PM    [facility]

## 2020-11-01 ENCOUNTER — Ambulatory Visit: Payer: 59 | Admitting: Physician Assistant

## 2020-11-01 NOTE — Progress Notes (Signed)
Cardiology Office Note:    Date:  11/02/2020   ID:  Joseph Wilkerson, DOB November 23, 1955, MRN [id number]  PCP:  Prince Solian, MD Referring MD: Prince Solian, [place]  Cardiologist:  Quay Burow, MD   Reason for visit: Hospital f/u  History of Present Illness:    Joseph Wilkerson is a 65 y.o. male with a hx of HTN, HLD, DM, sleep apnea and tobacco abuse admitted 10/09/20 with inferior STEMI.  Mr. Ory presented with sudden onset mid-chest tightness while watching TV. The pain radiated to his left arm and associated with mild dyspnea, clammy, sweating, and lightheadedness.  Emergent cath showed occluded proximal RCA s/p PCI with DES. He had residual 80% circumflex stenosis for which he underwent staged PCI with DES next day.  Echo showed LVEF of 55-60%, no regional WM abnormality. Grade 1 DD.  Short runs of NSVT overnight post emergent PCI. Resolved with addition of BB and PCI.  Had SOB following PCI.  ?Due brillinta but resolved next day. Treated with one dose of IV lasix with resolution of symptoms.  Patient followed up with his PCP who prescribed him Chantix to aid tobacco cessation.  Today, he is doing well with no further angina.  He states he has further improved his diet since his heart attack.  He is trying to increase his exercise but is limited by left knee arthritis.  He plans to quit smoking on Monday.  He did have some shortness of breath related to Brilinta use which is has greatly improved.  He mentions that he is due for a repeat sleep study.  He has episodes of vertigo for which she has been prescribed meclizine.  Otherwise, he denies PND, orthopnea, leg swelling, weight gain, palpitations, lightheadedness, syncope and bleeding.  He did have some groin bruising post stenting which has greatly improved.  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  He mentions very supportive staff and son that lives with him.  Past  Medical History:  Diagnosis Date   Allergic rhinitis    Allergy    Arthritis    Depression    Diabetes ([place])    GERD (gastroesophageal reflux disease)    Hemorrhoids    HTN (hypertension)    Hyperlipidemia    Nephrolithiasis    OSA (obstructive sleep apnea)    Sleep apnea     Past Surgical History:  Procedure Laterality Date   COLONOSCOPY     CORONARY STENT INTERVENTION N/A 10/11/2020   Procedure: CORONARY STENT INTERVENTION;  Surgeon: Lorretta Harp, MD;  Location: [facility];  Service: Cardiovascular;  Laterality: N/A;   CORONARY/GRAFT ACUTE MI REVASCULARIZATION N/A 10/10/2020   Procedure: Coronary/Graft Acute MI Revascularization;  Surgeon: Lorretta Harp, MD;  Location: [facility];  Service: Cardiovascular;  Laterality: N/A;   KYPHOPLASTY  06/18/2012   sec L Comp Fx   LEFT HEART CATH AND CORONARY ANGIOGRAPHY N/A 10/10/2020   Procedure: LEFT HEART CATH AND CORONARY ANGIOGRAPHY;  Surgeon: Lorretta Harp, MD;  Location: [facility];  Service: Cardiovascular;  Laterality: N/A;    Current Medications: Current Meds  Medication Sig   aspirin EC 81 MG tablet Take 81 mg by mouth daily.   atorvastatin (LIPITOR) 80 MG tablet Take 1 tablet (80 mg total) by mouth daily at 8 pm.   fluticasone (FLONASE) 50 MCG/ACT nasal spray Place 2 sprays into both nostrils daily as  needed for allergies.   irbesartan (AVAPRO) 150 MG tablet Take 1 tablet (150 mg total) by mouth daily.   Meclizine HCl 25 MG CHEW Chew 25 mg by mouth every 6 (six) hours as needed for dizziness.   metFORMIN (GLUCOPHAGE-XR) 500 MG 24 hr tablet Take 1,000 mg by mouth 2 (two) times daily with a meal.   metoprolol succinate (TOPROL XL) 25 MG 24 hr tablet Take 1 tablet (25 mg total) by mouth daily.   nitroGLYCERIN (NITROSTAT) 0.4 MG SL tablet Place 1 tablet (0.4 mg total) under the tongue every 5 (five) minutes as needed.   ONETOUCH VERIO test strip USE 1 STRIP TO CHECK GLUCOSE TWICE DAILY    sertraline (ZOLOFT) 50 MG tablet Take 50 mg by mouth daily.   STEGLATRO 5 MG TABS Take 5 mg by mouth daily.   Study - SOS-AMI - selatogrel 16 mg/0.5 mL or placebo SQ injection (PI-Christopher) Inject 0.5 mLs (16 mg total) into the skin as needed (if experiencing symptoms of a heart attack).   ticagrelor (BRILINTA) 90 MG TABS tablet Take 1 tablet (90 mg total) by mouth 2 (two) times daily.   [DISCONTINUED] metoprolol tartrate (LOPRESSOR) 25 MG tablet Take 0.5 tablets (12.5 mg total) by mouth 2 (two) times daily.     Allergies:   Patient has no known allergies.   Social History   Socioeconomic History   Marital status: Widowed    Spouse name: Not on file   Number of children: 3   Years of education: Not on file   Highest education level: Not on file  Occupational History   Occupation: Hospitality    Employer: [employer]  Tobacco Use   Smoking status: Every Day    Packs/day: 1.00    Types: Cigarettes   Smokeless tobacco: Never  Vaping Use   Vaping Use: Never used  Substance and Sexual Activity   Alcohol use: Yes    Comment: Socially-once per month   Drug use: No   Sexual activity: Not on file  Other Topics Concern   Not on file  Social History Narrative   Not on file   Social Determinants of Health   Financial Resource Strain: Not on file  Food Insecurity: Not on file  Transportation Needs: Not on file  Physical Activity: Not on file  Stress: Not on file  Social Connections: Not on file     Family History: The patient's family history includes CAD in his brother and father; Cancer in his mother; Diabetes in his brother; Heart attack in his father. There is no history of Colon cancer, Pancreatic cancer, Rectal cancer, Stomach cancer, or Esophageal cancer.  ROS:   Please see the history of present illness.     EKGs/Labs/Other Studies Reviewed:    Recent Labs: 10/12/2020: BUN 16; Creatinine, Ser 0.76; Hemoglobin 13.9; Platelets 243; Potassium 3.7; Sodium 134  Recent  Lipid Panel    Component Value Date/Time   CHOL 198 10/09/2020 2357   TRIG 222 (H) 10/09/2020 2357   HDL 34 (L) 10/09/2020 2357   CHOLHDL 5.8 10/09/2020 2357   VLDL 44 (H) 10/09/2020 2357   LDLCALC 120 (H) 10/09/2020 2357    Physical Exam:    VS:  BP (!) 138/96   Pulse 98   Resp 20   Ht '5\' 11"'$  (1.803 m)   Wt 236 lb (107 kg)   SpO2 97%   BMI 32.92 kg/m     Wt Readings from Last 3 Encounters:  11/02/20 236 lb (107  kg)  10/10/20 236 lb 1.8 oz (107.1 kg)  11/12/18 234 lb (106.1 kg)     GEN:  Well nourished, well developed in no acute distress, obese HEENT: Normal NECK: No JVD; No carotid bruits CARDIAC: RRR, no murmurs, rubs, gallops RESPIRATORY:  Clear to auscultation without rales, wheezing or rhonchi  ABDOMEN: Soft, non-tender, non-distended MUSCULOSKELETAL: No edema; No deformity  SKIN: Warm and dry NEUROLOGIC:  Alert and oriented PSYCHIATRIC:  Normal affect   ASSESSMENT AND PLAN   Inferior STEMI/CAD -no angina -Continue DAPT with Brilinta. -He is out of metoprolol x 4 days.  Will change him to Toprol XL '25mg'$  once daily. -Continue statin, beta-blocker and ARB therapy.   -Recommend cardiac rehab.   Hypertension, BP elevated -Blood pressure slightly elevated today.  Irbesartan was decreased in the hospital due to dip of systolic blood pressure less than 100.  He does not have a working blood pressure cuff at home. -Nicotine cessation can improve blood pressure, therefore I have asked him to buy a BP cuff & keep a blood pressure log.  If his blood pressure remains over 130/80 1 month from now, I recommend increasing his irbesartan back to 300 mg daily. - Goal BP is <130/80.  Recommend DASH diet (high in vegetables, fruits, low-fat dairy products, whole grains, poultry, fish, and nuts and low in sweets, sugar-sweetened beverages, and red meats), salt restriction and increase physical activity.  Hyperlipidemia with goal LDL <70 - 10/09/2020: Cholesterol 198; HDL 34; LDL  Cholesterol 120; Triglycerides 222; VLDL 44  - Continue Lipitor 80 mg daily. - Lipid panel and LFTS in 6 weeks  - Discussed cholesterol lowering diets - Mediterranean diet, DASH diet, vegetarian diet, low-carbohydrate diet and avoidance of trans fats.  Discussed healthier choice substitutes.  Nuts, high-fiber foods, and fiber supplements may also improve lipids.    Obesity - Discussed how even a 5-10% weight loss can have cardiovascular benefits.   - Recommend moderate intensity activity for 30 minutes 5 days/week and the DASH diet.  Tobacco use  - Plans to quit on Monday.  He has Chantix at home and has notified his family and friends of his quit date. - Recommend tobacco cessation.  Reviewed physiologic effects of nicotine and the immediate-eventual benefits of quitting including improvement in cough/breathing and reduction in cardiovascular events.  Discussed quitting tips such as removing triggers and getting support from family/friends and [facility] [place].  Disposition - Follow-up in 2 months.  We will ensure he is able to get in a cardiac rehab, has quit smoking and that his lipids/BP are better controlled.         Medication Adjustments/Labs and Tests Ordered: Current medicines are reviewed at length with the patient today.  Concerns regarding medicines are outlined above.  Orders Placed This Encounter  Procedures   Lipid panel   Hepatic function panel   Meds ordered this encounter  Medications   metoprolol succinate (TOPROL XL) 25 MG 24 hr tablet    Sig: Take 1 tablet (25 mg total) by mouth daily.    Dispense:  90 tablet    Refill:  3    Patient Instructions  Medication Instructions:  STOP Metoprolol Tartrate (Lopressor)  START Metoprolol Succinate (Toprol-XL) 25 mg daily  *If you need a refill on your cardiac medications before your next appointment, please call your pharmacy*   Lab Work: Your physician recommends that you return for lab work in 6 weeks:  Fasting  Lipid Panel-DO NOT EAT OR DRINK PAST MIDNIGHT.  OKAY TO HAVE WATER. Hepatic (Liver) Function Test  If you have labs (blood work) drawn today and your tests are completely normal, you will receive your results only by: MyChart Message (if you have MyChart) OR A paper copy in the mail If you have any lab test that is abnormal or we need to change your treatment, we will call you to review the results.   Testing/Procedures: NONE ordered at this time of appointment   Follow-Up: At [facility], you and your health needs are our priority.  As part of our continuing mission to provide you with exceptional heart care, we have created designated Provider Care Teams.  These Care Teams include your primary Cardiologist (physician) and Advanced Practice Providers (APPs -  Physician Assistants and Nurse Practitioners) who all work together to provide you with the care you need, when you need it.   Your next appointment:   8 week(s)  The format for your next appointment:   In Person  Provider:   Caron Presume, PA-C  Other Instructions Start a Blood pressure log after you have quit smoking  Talk to your PCP about PT for your vertigo    Signed, Gaston Islam  11/02/2020 11:07 AM    [place]

## 2020-11-02 ENCOUNTER — Encounter: Payer: Self-pay | Admitting: Physician Assistant

## 2020-11-02 ENCOUNTER — Other Ambulatory Visit: Payer: Self-pay

## 2020-11-02 ENCOUNTER — Other Ambulatory Visit: Payer: Self-pay | Admitting: Physician Assistant

## 2020-11-02 ENCOUNTER — Ambulatory Visit (INDEPENDENT_AMBULATORY_CARE_PROVIDER_SITE_OTHER): Payer: 59 | Admitting: Physician Assistant

## 2020-11-02 VITALS — BP 138/96 | HR 98 | Resp 20 | Ht 71.0 in | Wt 236.0 lb

## 2020-11-02 DIAGNOSIS — I2510 Atherosclerotic heart disease of native coronary artery without angina pectoris: Secondary | ICD-10-CM | POA: Diagnosis not present

## 2020-11-02 DIAGNOSIS — I10 Essential (primary) hypertension: Secondary | ICD-10-CM | POA: Diagnosis not present

## 2020-11-02 DIAGNOSIS — Z720 Tobacco use: Secondary | ICD-10-CM

## 2020-11-02 DIAGNOSIS — E785 Hyperlipidemia, unspecified: Secondary | ICD-10-CM | POA: Diagnosis not present

## 2020-11-02 DIAGNOSIS — E669 Obesity, unspecified: Secondary | ICD-10-CM | POA: Insufficient documentation

## 2020-11-02 MED ORDER — METOPROLOL SUCCINATE ER 25 MG PO TB24
25.0000 mg | ORAL_TABLET | Freq: Every day | ORAL | 3 refills | Status: AC
Start: 2020-11-02 — End: ?

## 2020-11-02 NOTE — Patient Instructions (Signed)
Medication Instructions:  STOP Metoprolol Tartrate (Lopressor)  START Metoprolol Succinate (Toprol-XL) 25 mg daily  *If you need a refill on your cardiac medications before your next appointment, please call your pharmacy*   Lab Work: Your physician recommends that you return for lab work in 6 weeks:  Fasting Lipid Panel-DO NOT EAT OR DRINK PAST MIDNIGHT. OKAY TO HAVE WATER. Hepatic (Liver) Function Test  If you have labs (blood work) drawn today and your tests are completely normal, you will receive your results only by: MyChart Message (if you have MyChart) OR A paper copy in the mail If you have any lab test that is abnormal or we need to change your treatment, we will call you to review the results.   Testing/Procedures: NONE ordered at this time of appointment   Follow-Up: At [facility], you and your health needs are our priority.  As part of our continuing mission to provide you with exceptional heart care, we have created designated Provider Care Teams.  These Care Teams include your primary Cardiologist (physician) and Advanced Practice Providers (APPs -  Physician Assistants and Nurse Practitioners) who all work together to provide you with the care you need, when you need it.   Your next appointment:   8 week(s)  The format for your next appointment:   In Person  Provider:   Caron Presume, PA-C  Other Instructions Start a Blood pressure log after you have quit smoking  Talk to your PCP about PT for your vertigo

## 2020-11-06 ENCOUNTER — Encounter: Payer: Self-pay | Admitting: *Deleted

## 2020-11-06 DIAGNOSIS — Z006 Encounter for examination for normal comparison and control in clinical research program: Secondary | ICD-10-CM

## 2020-11-09 NOTE — Research (Signed)
[id number]     FOLLOW-UP PHONE CALLS  SUBJECT ID:  [id number] Trainer's name: Shirley Muscat, RN  Trainer's signature: on Delegation of Authority Log Date of the Follow up call:     06-Nov-2020  Visit Number: 3 Start time of the Follow up call:  1400        End time of call: 1410  Patient saw the Cardiologist on 11-02-2020 and reported that he had no CP. They changed his Metoprolol to Toprol XL 25 mg daily. He has Chantix available and he is working at completely quitting smoking. 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 He still has had no CP; no AEs, SAEs, Hospitalizations or Urgent care visits since his last study call.  [name] answered all the study visit questions regarding his autoinjector and it's use. He has all of my contact information in case he has any questions or concerns.   He has an appt with [facility] on 01-02-21 and we will see him afterwards to swap out his expiring autoinjectors.    - CONTACT  Q1;  Was the follow up phone call done with the subject?  '[x]'$   YES  '[]'$   NO           If NO, check the following:      Q2:    Was the follow up phone call done with a family member               Or caregiver?    '[]'$   YES   '[x]'$   NO          Make note about reasons ___________________________________    - MEDICAL CONDITION      Q3:  Did any of the following occur?   '[]'$   Death       '[]'$   Hospitalization (any cause)     NONE    '[]'$   Use of autoinjector  Make note about the type of event, and when it occurred. In case of hospitalization: the Location of the hospital and/or the treating physician's contact details  ____________ ____________________________________________________________________ ____________________________________________________________________  Note: remember to report any SAE/AE which has occurred within 30 days after any  injection of the study drug on relevant eCRF forms.   Q4:  Did  the subject develop any condition which is an          exclusion criterion?  '[]'$   YES  '[x]'$   NO   Take note about the occurrence of any exclusion criterion after randomization: _________________________________________________________________ __________________________________________________________________  Q5: Was there any change in subject's antithrombotic therapy?   '[]'$   YES  '[x]'$   NO     Take not about any change of antithrombotic treatment: _______________________ ____________________________________________________________________ ____________________________________________________________________      Lezlie Octave OF THE STUDY-SPECFIC TRAINING  Q6: Did the subject correctly reply to the following questions?       A.  What are common heart attack symptoms?      '[x]'$   YES   '[]'$   NO      B.  What has to be done in case any of those symptoms occurs? '[x]'$   YES  '[]'$   NO      C.  What are the main steps to perform a self-injection?  '[x]'$   YES  '[]'$   NO             If NO, then report which step/s was/were missing:        '[]'$   Choose injection site (abdomen or thigh)       '[]'$   Twist cap off       '[]'$   Pinch skin and place the study autoinjector       '[]'$   Firmly push down and hold for 3 seconds       D. What has to be done immediately after an injection?  '[x]'$   YES   '[]'$   NO          If NO, then report which step/s was/were missing:       '[]'$   Call  for emergency medical help       '[]'$   Show the autoinjector to the emergency medical responder       E.  Does the subject recall where s/he keeps/ stores the autoinjectors? '[x]'$  YES  '[]'$  NO          Note the place of storage and any corrective explanation if needed below __________________________________________________________________ __________________________________________________________________  - TRAINING REFRESHER   Q7:  Is a training refresher needed?      '[]'$  YES  '[x]'$  NO        If YES, indicate items that have to be refreshed. More  than one may apply:               '[]'$   Heart attack symptoms             '[]'$   Actions to be taken following heart attack symptoms             '[]'$   Steps to perform the self-injection and follow-up actions to be taken   '[]'$   Other, Specify  _________________________________________     Form Based on [facility] SOS-AMI_CRF_Version 5.0_27Oct2021 [id number], [id number],            and eCRF  Version 5.0-  28-Dec-2019   Current Outpatient Medications:    aspirin EC 81 MG tablet, Take 81 mg by mouth daily., Disp: , Rfl:    atorvastatin (LIPITOR) 80 MG tablet, Take 1 tablet (80 mg total) by mouth daily at 8 pm., Disp: 90 tablet, Rfl: 3   fluticasone (FLONASE) 50 MCG/ACT nasal spray, Place 2 sprays into both nostrils daily as needed for allergies., Disp: , Rfl:    irbesartan (AVAPRO) 150 MG tablet, Take 1 tablet (150 mg total) by mouth daily., Disp: 30 tablet, Rfl: 6   Meclizine HCl 25 MG CHEW, Chew 25 mg by mouth every 6 (six) hours as needed for dizziness., Disp: , Rfl:    metFORMIN (GLUCOPHAGE-XR) 500 MG 24 hr tablet, Take 1,000 mg by mouth 2 (two) times daily with a meal., Disp: , Rfl:    metoprolol succinate (TOPROL XL) 25 MG 24 hr tablet, Take 1 tablet (25 mg total) by mouth daily., Disp: 90 tablet, Rfl: 3   nitroGLYCERIN (NITROSTAT) 0.4 MG SL tablet, Place 1 tablet (0.4 mg total) under the tongue every 5 (five) minutes as needed., Disp: 25 tablet, Rfl: 12   ONETOUCH VERIO test strip, USE 1 STRIP TO CHECK GLUCOSE TWICE DAILY, Disp: , Rfl:    sertraline (ZOLOFT) 50 MG tablet, Take 50 mg by mouth daily., Disp: , Rfl:    STEGLATRO 5 MG TABS, Take 5 mg by mouth daily., Disp: , Rfl:    Study - SOS-AMI - selatogrel 16 mg/0.5 mL or placebo SQ injection (PI-Christopher), Inject 0.5 mLs (16 mg total) into the skin as needed (if experiencing symptoms of a heart attack)., Disp: , Rfl:    ticagrelor (BRILINTA)  90 MG TABS tablet, Take 1 tablet (90 mg total) by mouth 2 (two) times daily., Disp: 60 tablet, Rfl: 11    varenicline (CHANTIX) 1 MG tablet, Take by mouth., Disp: , Rfl:

## 2020-11-29 ENCOUNTER — Encounter (HOSPITAL_COMMUNITY): Payer: Self-pay

## 2020-11-29 ENCOUNTER — Telehealth (HOSPITAL_COMMUNITY): Payer: Self-pay

## 2020-11-29 NOTE — Telephone Encounter (Signed)
Attempted to call patient in regards to Cardiac Rehab - LM on VM Mailed letter 

## 2020-12-17 ENCOUNTER — Telehealth (HOSPITAL_COMMUNITY): Payer: Self-pay

## 2020-12-17 NOTE — Telephone Encounter (Signed)
No repsonse from pt.  Closed referral  

## 2021-01-02 ENCOUNTER — Ambulatory Visit: Payer: 59 | Admitting: Physician Assistant

## 2021-01-07 ENCOUNTER — Other Ambulatory Visit: Payer: Self-pay

## 2021-01-07 ENCOUNTER — Encounter: Payer: 59 | Admitting: *Deleted

## 2021-01-07 DIAGNOSIS — Z006 Encounter for examination for normal comparison and control in clinical research program: Secondary | ICD-10-CM

## 2021-01-07 MED ORDER — STUDY - SOS-AMI - SELATOGREL 16 MG/0.5 ML OR PLACEBO SQ INJECTION (PI-CHRISTOPHER): 16.0000 mg | INJECTION | SUBCUTANEOUS | Status: DC | PRN

## 2021-01-07 MED ORDER — STUDY - SOS-AMI - SELATOGREL 16 MG/0.5 ML OR PLACEBO SQ INJECTION (PI-CHRISTOPHER): 16.0000 mg | INJECTION | SUBCUTANEOUS | 0 refills | Status: DC | PRN

## 2021-01-07 NOTE — Research (Signed)
Patient came in to switch out his expiring autoinjectors.

## 2021-02-11 NOTE — Progress Notes (Deleted)
Cardiology Clinic Note   Patient Name: BERND CROM Date of Encounter: 02/11/2021  Primary Care Provider:  Prince Solian, MD Primary Cardiologist:  Quay Burow, MD  Patient Profile    SIDDHANTH DENK presents to the clinic today for follow-up evaluation for his hypertension and coronary artery disease.  Past Medical History    Past Medical History:  Diagnosis Date   Allergic rhinitis    Allergy    Arthritis    Depression    Diabetes (HCC)    GERD (gastroesophageal reflux disease)    Hemorrhoids    HTN (hypertension)    Hyperlipidemia    Nephrolithiasis    OSA (obstructive sleep apnea)    Sleep apnea    Past Surgical History:  Procedure Laterality Date   COLONOSCOPY     CORONARY STENT INTERVENTION N/A 10/11/2020   Procedure: CORONARY STENT INTERVENTION;  Surgeon: Lorretta Harp, MD;  Location: [facility];  Service: Cardiovascular;  Laterality: N/A;   CORONARY/GRAFT ACUTE MI REVASCULARIZATION N/A 10/10/2020   Procedure: Coronary/Graft Acute MI Revascularization;  Surgeon: Lorretta Harp, MD;  Location: [facility];  Service: Cardiovascular;  Laterality: N/A;   KYPHOPLASTY  06/18/2012   sec L Comp Fx   LEFT HEART CATH AND CORONARY ANGIOGRAPHY N/A 10/10/2020   Procedure: LEFT HEART CATH AND CORONARY ANGIOGRAPHY;  Surgeon: Lorretta Harp, MD;  Location: [facility];  Service: Cardiovascular;  Laterality: N/A;    Allergies  No Known Allergies  History of Present Illness    AMIL BOUWMAN is a PMH of hyperlipidemia, hyper tension, diabetes, sleep apnea, tobacco abuse and coronary artery disease status post STEMI 10/09/2020.  He presented to the emergency department with sudden onset of mid chest tightness while he had been watching TV.  Pain radiated to his left arm and was associated with mild dyspnea, diaphoresis, and lightheadedness.  He underwent cardiac catheterization which showed proximal RCA occlusion and he received PCI  with DES x1.  He was also noted to have residual 80% circumflex stenosis and underwent staged PCI with DES x1 the next day.  His echocardiogram showed an LVEF of 55-60%, G1 DD, and no significant valvular abnormalities.  He was noted to have short runs of NSVT overnight post cath.  His NSVT resolved with addition of beta-blockade therapy.  He was noted to have shortness of breath following PCI.  He received IV furosemide and symptoms resolved.  He followed up with his PCP who prescribed Chantix to aid with his tobacco cessation.  He was seen by Caron Presume, PA-C on 11/02/2020.  During that time he denied further episodes of angina.  He has been following a more strict heart healthy diet.  He has tried to increase his physical activity but was limited due to his knee arthritis.  He planned to quit smoking.  He discussed repeat sleep study.  He had been prescribed meclizine for vertigo.  He denied orthopnea, PND, lower extremity swelling, weight gain, palpitations, presyncope and bleeding.  His blood pressure was slightly elevated after his irbesartan had been decreased during his hospitalization.  His irbesartan was continued and his metoprolol tartrate was transitioned to metoprolol succinate.  He presents to the clinic today for follow-up evaluation states***  *** denies chest pain, shortness of breath, lower extremity edema, fatigue, palpitations, melena, hematuria, hemoptysis, diaphoresis, weakness, presyncope, syncope, orthopnea, and PND.   Home Medications    Prior to Admission medications   Medication Sig Start Date End  Date Taking? Authorizing Provider  aspirin EC 81 MG tablet Take 81 mg by mouth daily.    [provider]  atorvastatin (LIPITOR) 80 MG tablet Take 1 tablet (80 mg total) by mouth daily at 8 pm. 10/12/20   Bhagat, Bhavinkumar, PA  fluticasone (FLONASE) 50 MCG/ACT nasal spray Place 2 sprays into both nostrils daily as needed for allergies. 05/30/18   [provider]  irbesartan (AVAPRO) 150 MG tablet Take 1 tablet (150 mg total) by mouth daily. 10/12/20   Leanor Kail, PA  Meclizine HCl 25 MG CHEW Chew 25 mg by mouth every 6 (six) hours as needed for dizziness. 07/27/20   [provider]  metFORMIN (GLUCOPHAGE-XR) 500 MG 24 hr tablet Take 1,000 mg by mouth 2 (two) times daily with a meal.    [provider]  metoprolol succinate (TOPROL XL) 25 MG 24 hr tablet Take 1 tablet (25 mg total) by mouth daily. 11/02/20   Warren Lacy, PA-C  nitroGLYCERIN (NITROSTAT) 0.4 MG SL tablet Place 1 tablet (0.4 mg total) under the tongue every 5 (five) minutes as needed. 10/12/20   Bhagat, Crista Luria, PA  ONETOUCH VERIO test strip USE 1 STRIP TO CHECK GLUCOSE TWICE DAILY 10/25/18   [provider]  sertraline (ZOLOFT) 50 MG tablet Take 50 mg by mouth daily. 09/21/20   [provider]  STEGLATRO 5 MG TABS Take 5 mg by mouth daily. 10/21/18   [provider]  Study - [facility] - selatogrel 16 mg/0.5 mL or placebo SQ injection (PI-Christopher) Inject 0.5 mLs (16 mg total) into the skin as needed (if experiencing symptoms of a heart attack). 01/07/21   Buford Dresser, MD  ticagrelor (BRILINTA) 90 MG TABS tablet Take 1 tablet (90 mg total) by mouth 2 (two) times daily. 10/12/20   Leanor Kail, PA  varenicline (CHANTIX) 1 MG tablet Take by mouth. 10/26/20   [provider]    Family History    Family History  Problem Relation Age of Onset   CAD Father    Heart attack Father    Cancer Mother        ? mets   Diabetes Brother    CAD Brother    Colon cancer Neg Hx    Pancreatic cancer Neg Hx    Rectal cancer Neg Hx    Stomach cancer Neg Hx    Esophageal cancer Neg Hx    He indicated that the status of his mother is unknown. He indicated that the status of his father is unknown. He indicated that the status of his neg hx is unknown.  Social History    Social History   Socioeconomic  History   Marital status: Widowed    Spouse name: Not on file   Number of children: 3   Years of education: Not on file   Highest education level: Not on file  Occupational History   Occupation: Hospitality    Employer: [employer]  Tobacco Use   Smoking status: Every Day    Packs/day: 1.00    Types: Cigarettes   Smokeless tobacco: Never  Vaping Use   Vaping Use: Never used  Substance and Sexual Activity   Alcohol use: Yes    Comment: Socially-once per month   Drug use: No   Sexual activity: Not on file  Other Topics Concern   Not on file  Social History Narrative   Not on file   Social Determinants of Health   Financial Resource Strain: Not on  file  Food Insecurity: Not on file  Transportation Needs: Not on file  Physical Activity: Not on file  Stress: Not on file  Social Connections: Not on file  Intimate Partner Violence: Not on file     Review of Systems    General:  No chills, fever, night sweats or weight changes.  Cardiovascular:  No chest pain, dyspnea on exertion, edema, orthopnea, palpitations, paroxysmal nocturnal dyspnea. Dermatological: No rash, lesions/masses Respiratory: No cough, dyspnea Urologic: No hematuria, dysuria Abdominal:   No nausea, vomiting, diarrhea, bright red blood per rectum, melena, or hematemesis Neurologic:  No visual changes, wkns, changes in mental status. All other systems reviewed and are otherwise negative except as noted above.  Physical Exam    VS:  There were no vitals taken for this visit. , BMI There is no height or weight on file to calculate BMI. GEN: Well nourished, well developed, in no acute distress. HEENT: normal. Neck: Supple, no JVD, carotid bruits, or masses. Cardiac: RRR, no murmurs, rubs, or gallops. No clubbing, cyanosis, edema.  Radials/DP/PT 2+ and equal bilaterally.  Respiratory:  Respirations regular and unlabored, clear to auscultation bilaterally. GI: Soft, nontender, nondistended, BS + x 4. MS: no  deformity or atrophy. Skin: warm and dry, no rash. Neuro:  Strength and sensation are intact. Psych: Normal affect.  Accessory Clinical Findings    Recent Labs: 10/12/2020: BUN 16; Creatinine, Ser 0.76; Hemoglobin 13.9; Platelets 243; Potassium 3.7; Sodium 134   Recent Lipid Panel    Component Value Date/Time   CHOL 198 10/09/2020 2357   TRIG 222 (H) 10/09/2020 2357   HDL 34 (L) 10/09/2020 2357   CHOLHDL 5.8 10/09/2020 2357   VLDL 44 (H) 10/09/2020 2357   LDLCALC 120 (H) 10/09/2020 2357    ECG personally reviewed by me today- *** - No acute changes  Echocardiogram 10/10/2020 IMPRESSIONS     1. Left ventricular ejection fraction, by estimation, is 55 to 60%. The  left ventricle has normal function. The left ventricle has no regional  wall motion abnormalities. There is mild left ventricular hypertrophy.  Left ventricular diastolic parameters  are consistent with Grade I diastolic dysfunction (impaired relaxation).   2. Right ventricular systolic function is normal. The right ventricular  size is normal.   3. The mitral valve is grossly normal. No evidence of mitral valve  regurgitation.   4. The aortic valve is tricuspid. Aortic valve regurgitation is trivial.  Mild aortic valve sclerosis is present, with no evidence of aortic valve  stenosis.   5. Aortic dilatation noted. There is borderline dilatation of the aortic  root, measuring 38 mm. There is mild dilatation of the ascending aorta,  measuring 43 mm.   Comparison(s): No prior Echocardiogram.   Cardiac catheterization 10/10/2020 and 10/11/2020    Prox Cx to Mid Cx lesion is 90% stenosed.   Ost RCA to Prox RCA lesion is 100% stenosed.   A drug-eluting stent was successfully placed using a STENT ONYX FRONTIER 3.0X22.   Post intervention, there is a 0% residual stenosis.   KENN REKOWSKI is a 65 y.o. male      [id number] LOCATION:  FACILITY: [facility]  PHYSICIAN: Quay Burow, M.D. Sep 22, 1955      Prox Cx to  Mid Cx lesion is 90% stenosed.   Non-stenotic Ost RCA to Prox RCA lesion was previously treated.   A drug-eluting stent was successfully placed using a STENT ONYX FRONTIER 3.0X15.   Post intervention, there is a 0% residual stenosis.  KEYANTE DURIO is a 65 y.o. male      [id number] LOCATION:  FACILITY: [facility]  PHYSICIAN: Quay Burow, M.D. [birth year]     DATE OF PROCEDURE:  10/11/2020  Diagnostic Dominance: Right Intervention     Assessment & Plan   1.  Essential hypertension-BP today***.  Well-controlled with transition to metoprolol succinate.  Continues heart healthy diet and salt restriction. Continue metoprolol, irbesartan Heart healthy low-sodium diet-salty 6 given Increase physical activity as tolerated Maintain blood pressure log  Coronary artery disease-status post inferior STEMI.  Underwent cardiac catheterization with PCI and DES which was staged to his RCA.  Circumflex.  Details above.  Continue meds to deny episodes of arm neck back and chest discomfort. Continue aspirin, Brilinta, metoprolol, atorvastatin, nitroglycerin Heart healthy low-sodium diet-salty 6 given Increase physical activity as tolerated  Hyperlipidemia-10/09/2020: Cholesterol 198; HDL 34; LDL Cholesterol 120; Triglycerides 222; VLDL 44 Continue aspirin, atorvastatin Heart healthy low-sodium high-fiber diet Increase physical activity as tolerated Repeat fasting lipids and LFTs  Diabetes mellitus-follows with PCP. Continue metformin,Steglatro Heart healthy low-sodium diet-salty 6 given Increase physical activity as tolerated  Tobacco use-stop smoking.  Congratulated on cessation.   Disposition: Follow-up with Dr. Gwenlyn Found in 3 months.  Jossie Ng. Yuliya Nova NP-C    02/11/2021, 9:43 AM [facility] [address] Office [phone] Fax [phone]  Notice: This dictation was prepared with Dragon dictation along with smaller phrase technology. Any  transcriptional errors that result from this process are unintentional and may not be corrected upon review.  I spent***minutes examining this patient, reviewing medications, and using patient centered shared decision making involving her cardiac care.  Prior to her visit I spent greater than 20 minutes reviewing her past medical history,  medications, and prior cardiac tests.

## 2021-02-12 ENCOUNTER — Ambulatory Visit: Payer: 59 | Admitting: General Practice

## 2021-02-18 ENCOUNTER — Encounter: Payer: Self-pay | Admitting: *Deleted

## 2021-02-18 DIAGNOSIS — Z006 Encounter for examination for normal comparison and control in clinical research program: Secondary | ICD-10-CM

## 2021-02-18 NOTE — Research (Addendum)
[id number], [id number]     FOLLOW-UP PHONE CALLS  SUBJECT ID:  [id number] Trainer's name: Shirley Muscat, RN  Trainer's signature: on Delegation of Authority Log Date of the Follow up call:    18-Feb-2021  Visit Number: 4 Start time of the Follow up call:  1600    End time of call: 1605  Patient is doing well, no complaints of CP or SHOB. He reports no visits to the ED or Urgent care. Joseph Wilkerson answered all study questions appropriately.  - CONTACT  Q1;  Was the follow up phone call done with the subject?  [x]   YES  []   NO           If NO, check the following:      Q2:    Was the follow up phone call done with a family member               Or caregiver?    []   YES   []   NO          Make note about reasons ___________________________________    - MEDICAL CONDITION      Q3:  Did any of the following occur?   []   Death  None     []   Hospitalization (any cause)         []   Use of autoinjector  Make note about the type of event, and when it occurred. In case of hospitalization: the Location of the hospital and/or the treating physician's contact details  ____________ ____________________________________________________________________ ____________________________________________________________________  Note: remember to report any SAE/AE which has occurred within 30 days after any  injection of the study drug on relevant eCRF forms.   Q4:  Did the subject develop any condition which is an          exclusion criterion?  []   YES  [x]   NO   Take note about the occurrence of any exclusion criterion after randomization: _________________________________________________________________ __________________________________________________________________  Q5: Was there any change in subject's antithrombotic therapy?   []   YES  [x]   NO     Take not about any change of antithrombotic treatment:  _______________________ ____________________________________________________________________ ____________________________________________________________________      Lezlie Octave OF THE STUDY-SPECFIC TRAINING  Q6: Did the subject correctly reply to the following questions?       A.  What are common heart attack symptoms?      [x]   YES   []   NO      B.  What has to be done in case any of those symptoms occurs? [x]   YES  []   NO      C.  What are the main steps to perform a self-injection?  [x]   YES  []   NO             If NO, then report which step/s was/were missing:        []   Choose injection site (abdomen or thigh)       []   Twist cap off       []   Pinch skin and place the study autoinjector       []   Firmly push down and hold for 3 seconds       D. What has to be done immediately after an injection?  [x]   YES   []   NO          If NO, then report which step/s was/were missing:       []   Call  for emergency medical help       []   Show the autoinjector to the emergency medical responder       E.  Does the subject recall where s/he keeps/ stores the autoinjectors? [x]  YES  []  NO          Note the place of storage and any corrective explanation if needed below __________________________________________________________________ __________________________________________________________________  - TRAINING REFRESHER   Q7:  Is a training refresher needed?      []  YES  [x]  NO        If YES, indicate items that have to be refreshed. More than one may apply:               []   Heart attack symptoms             []   Actions to be taken following heart attack symptoms             []   Steps to perform the self-injection and follow-up actions to be taken   []   Other, Specify  _________________________________________     Form Based on [facility]_CRF_Version 5.0_27Oct2021 [id number], 62,            and eCRF  Version 5.0-  28-Dec-2019   Current Outpatient Medications:    aspirin EC  81 MG tablet, Take 81 mg by mouth daily., Disp: , Rfl:    atorvastatin (LIPITOR) 80 MG tablet, Take 1 tablet (80 mg total) by mouth daily at 8 pm., Disp: 90 tablet, Rfl: 3   fluticasone (FLONASE) 50 MCG/ACT nasal spray, Place 2 sprays into both nostrils daily as needed for allergies., Disp: , Rfl:    irbesartan (AVAPRO) 150 MG tablet, Take 1 tablet (150 mg total) by mouth daily., Disp: 30 tablet, Rfl: 6   Meclizine HCl 25 MG CHEW, Chew 25 mg by mouth every 6 (six) hours as needed for dizziness., Disp: , Rfl:    metFORMIN (GLUCOPHAGE-XR) 500 MG 24 hr tablet, Take 1,000 mg by mouth 2 (two) times daily with a meal., Disp: , Rfl:    metoprolol succinate (TOPROL XL) 25 MG 24 hr tablet, Take 1 tablet (25 mg total) by mouth daily., Disp: 90 tablet, Rfl: 3   nitroGLYCERIN (NITROSTAT) 0.4 MG SL tablet, Place 1 tablet (0.4 mg total) under the tongue every 5 (five) minutes as needed., Disp: 25 tablet, Rfl: 12   ONETOUCH VERIO test strip, USE 1 STRIP TO CHECK GLUCOSE TWICE DAILY, Disp: , Rfl:    sertraline (ZOLOFT) 50 MG tablet, Take 50 mg by mouth daily., Disp: , Rfl:    STEGLATRO 5 MG TABS, Take 5 mg by mouth daily., Disp: , Rfl:    Study - [facility] - selatogrel 16 mg/0.5 mL or placebo SQ injection (PI-Christopher), Inject 0.5 mLs (16 mg total) into the skin as needed (if experiencing symptoms of a heart attack)., Disp: 0.5 mL, Rfl: 0   ticagrelor (BRILINTA) 90 MG TABS tablet, Take 1 tablet (90 mg total) by mouth 2 (two) times daily., Disp: 60 tablet, Rfl: 11   varenicline (CHANTIX) 1 MG tablet, Take by mouth., Disp: , Rfl:

## 2021-05-26 ENCOUNTER — Encounter: Payer: Self-pay | Admitting: *Deleted

## 2021-05-26 DIAGNOSIS — Z006 Encounter for examination for normal comparison and control in clinical research program: Secondary | ICD-10-CM

## 2021-05-31 NOTE — Research (Signed)
[id number], [facility]    ? ?FOLLOW-UP PHONE CALLS ? ?SUBJECT ID:  [id number] ?Trainer's name: Shirley Muscat, RN  ?Trainer's signature: on Delegation of Authority Log ?Date of the Follow up call:   26-May-2021 ? ?Visit Number: 5 ?Start time of the Follow up call: 12:45          End time of call: 12:53 ? ?Patient is doing well, no complaints of CP or SHOB. He reports no visits to the ED or Urgent care. Joseph Wilkerson answered all study questions appropriately. ? ?- CONTACT ? ?Q1;  Was the follow up phone call done with the subject?  '[x]'$   YES  '[]'$   NO ?          If NO, check the following:  ? ?   Q2:    Was the follow up phone call done with a family member  ?             Or caregiver?    '[]'$   YES   '[x]'$   NO  ?        Make note about reasons ___________________________________   ? ?- MEDICAL CONDITION     ? ?Q3:  Did any of the following occur?   '[]'$   Death ?      '[]'$   Hospitalization (any cause) ?    NONE    '[]'$   Use of autoinjector ? ?Make note about the type of event, and when it occurred. In case of hospitalization: the ?Location of the hospital and/or the treating physician's contact details  ____________ ?____________________________________________________________________ ?____________________________________________________________________ ? ?Note: remember to report any SAE/AE which has occurred within 30 days after any  ?injection of the study drug on relevant eCRF forms.  ? ?Q4:  Did the subject develop any condition which is an  ?        exclusion criterion?  '[]'$   YES  '[x]'$   NO  ? ?Take note about the occurrence of any exclusion criterion after randomization: ?_________________________________________________________________ ?__________________________________________________________________ ? ?Q5: Was there any change in subject's antithrombotic therapy?   '[]'$   YES  '[x]'$   NO    ? ?Take not about any change of antithrombotic treatment:  _______________________ ?____________________________________________________________________ ?____________________________________________________________________     ? ?- RECOLLECTION OF THE STUDY-SPECFIC TRAINING ? ?Q6: Did the subject correctly reply to the following questions? ? ?     A.  What are common heart attack symptoms?      '[x]'$   YES   '[]'$   NO ?     B.  What has to be done in case any of those symptoms occurs? '[x]'$   YES  '[]'$   NO ?     C.  What are the main steps to perform a self-injection?  '[x]'$   YES  '[]'$   NO ? ?           If NO, then report which step/s was/were missing:  ?      '[]'$   Choose injection site (abdomen or thigh) ?      '[]'$   Twist cap off ?      '[]'$   Pinch skin and place the study autoinjector ?      '[]'$   Firmly push down and hold for 3 seconds ? ?     D. What has to be done immediately after an injection?  '[x]'$   YES   '[]'$   NO ? ?        If NO, then report which step/s was/were missing: ?      '[]'$   Call  for emergency medical help ?      '[]'$   Show the autoinjector to the emergency medical responder ? ?     E.  Does the subject recall where s/he keeps/ stores the autoinjectors? '[x]'$  YES  '[]'$  NO ?         Note the place of storage and any corrective explanation if needed below ?__________________________________________________________________ ?__________________________________________________________________ ? ?- TRAINING REFRESHER  ? ?Q7:  Is a training refresher needed?      '[]'$  YES  '[x]'$  NO ?       If YES, indicate items that have to be refreshed. More than one may apply:  ? ?            '[]'$   Heart attack symptoms ?            '[]'$   Actions to be taken following heart attack symptoms ?            '[]'$   Steps to perform the self-injection and follow-up actions to be taken ?  '[]'$   Other, Specify  _________________________________________   ? ? ?Form Based on [facility]_CRF_Version 5.0_27Oct2021 [id number], 62,  ?          and eCRF  Version 5.0-  28-Dec-2019 ? ? ?Current Outpatient Medications:  ?  aspirin EC  81 MG tablet, Take 81 mg by mouth daily., Disp: , Rfl:  ?  atorvastatin (LIPITOR) 80 MG tablet, Take 1 tablet (80 mg total) by mouth daily at 8 pm., Disp: 90 tablet, Rfl: 3 ?  fluticasone (FLONASE) 50 MCG/ACT nasal spray, Place 2 sprays into both nostrils daily as needed for allergies., Disp: , Rfl:  ?  irbesartan (AVAPRO) 150 MG tablet, Take 1 tablet (150 mg total) by mouth daily., Disp: 30 tablet, Rfl: 6 ?  Meclizine HCl 25 MG CHEW, Chew 25 mg by mouth every 6 (six) hours as needed for dizziness., Disp: , Rfl:  ?  metFORMIN (GLUCOPHAGE-XR) 500 MG 24 hr tablet, Take 1,000 mg by mouth 2 (two) times daily with a meal., Disp: , Rfl:  ?  metoprolol succinate (TOPROL XL) 25 MG 24 hr tablet, Take 1 tablet (25 mg total) by mouth daily., Disp: 90 tablet, Rfl: 3 ?  nitroGLYCERIN (NITROSTAT) 0.4 MG SL tablet, Place 1 tablet (0.4 mg total) under the tongue every 5 (five) minutes as needed., Disp: 25 tablet, Rfl: 12 ?  ONETOUCH VERIO test strip, USE 1 STRIP TO CHECK GLUCOSE TWICE DAILY, Disp: , Rfl:  ?  sertraline (ZOLOFT) 50 MG tablet, Take 50 mg by mouth daily., Disp: , Rfl:  ?  STEGLATRO 5 MG TABS, Take 5 mg by mouth daily., Disp: , Rfl:  ?  Study - [facility] - selatogrel 16 mg/0.5 mL or placebo SQ injection (PI-Christopher), Inject 0.5 mLs (16 mg total) into the skin as needed (if experiencing symptoms of a heart attack)., Disp: 0.5 mL, Rfl: 0 ?  ticagrelor (BRILINTA) 90 MG TABS tablet, Take 1 tablet (90 mg total) by mouth 2 (two) times daily., Disp: 60 tablet, Rfl: 11 ?  varenicline (CHANTIX) 1 MG tablet, Take by mouth., Disp: , Rfl:   ? ? ? ? ? ? ? ? ?                ?

## 2021-08-01 ENCOUNTER — Encounter: Payer: Self-pay | Admitting: *Deleted

## 2021-08-01 DIAGNOSIS — Z006 Encounter for examination for normal comparison and control in clinical research program: Secondary | ICD-10-CM

## 2021-08-01 NOTE — Research (Signed)
[id number], Joseph Wilkerson     FOLLOW-UP PHONE CALLS  SUBJECT ID:  [id number] Trainer's name: Sandie Ano, RN Trainer's signature: on Delegation of Authority Log Date of the Follow up call:           01/Jun/2023 Visit Number: Visit 6  Start time of the Follow up call: 13:25                End time of call: 13:35   - CONTACT   Q1;  Was the follow up phone call done with the subject?  '[x]'$   YES  '[]'$   NO           If NO, check the following:      Q2:    Was the follow up phone call done with a family member               Or caregiver?    '[x]'$   YES   '[]'$   NO          Make note about reasons ___________________________________    AUTOINJECTOR LABEL:  Still legible?  '[x]'$   Yes  '[]'$   No  - MEDICAL CONDITION      Q3:  Did any of the following occur?   '[]'$   Death       '[]'$   Hospitalization (any cause)    No     '[]'$   Use of autoinjector  Make note about the type of event, and when it occurred. In case of hospitalization: the Location of the hospital and/or the treating physician's contact details  ____________ ____________________________________________________________________ ____________________________________________________________________  Note: remember to report any SAE/AE which has occurred within 30 days after any  injection of the study drug on relevant eCRF forms.   Q4:  Did the subject develop any condition which is an          exclusion criterion?  '[]'$   YES  '[x]'$   NO   Take note about the occurrence of any exclusion criterion after randomization: _________________________________________________________________ __________________________________________________________________  Q5: Was there any change in subject's antithrombotic therapy?   '[]'$   YES  '[x]'$   NO     Take not about any change of antithrombotic treatment:  _______________________ ____________________________________________________________________ ____________________________________________________________________      Lezlie Octave OF THE STUDY-SPECFIC TRAINING  Q6: Did the subject correctly reply to the following questions?       A.  What are common heart attack symptoms?      '[x]'$   YES   '[]'$   NO      B.  What has to be done in case any of those symptoms occurs? '[x]'$   YES  '[]'$   NO      C.  What are the main steps to perform a self-injection?  '[x]'$   YES  '[]'$   NO             If NO, then report which step/s was/were missing:        '[]'$   Choose injection site (abdomen or thigh)       '[]'$   Twist cap off       '[]'$   Pinch skin and place the study autoinjector       '[]'$   Firmly push down and hold for 3 seconds       D. What has to be done immediately after an injection?  '[x]'$   YES   '[]'$   NO          If NO, then report which step/s was/were missing:       '[]'$   Call  for emergency medical help       '[]'$   Show the autoinjector to the emergency medical responder       E.  Does the subject recall where s/he keeps/ stores the autoinjectors? '[x]'$  YES  '[]'$  NO          Note the place of storage and any corrective explanation if needed below __________________________________________________________________ __________________________________________________________________  - TRAINING REFRESHER   Q7:  Is a training refresher needed?      '[]'$  YES  '[x]'$  NO        If YES, indicate items that have to be refreshed. More than one may apply:               '[]'$   Heart attack symptoms             '[]'$   Actions to be taken following heart attack symptoms             '[]'$   Steps to perform the self-injection and follow-up actions to be taken   '[]'$   Other, Specify  _________________________________________     Form Based on [facility]OS-[facility]I_CRF_Version 5.0_27Oct2021 [id number], 62,            and eCRF  Version 5.0-  28-Dec-2019     Current Outpatient Medications:    aspirin  EC 81 MG tablet, Take 81 mg by mouth daily., Disp: , Rfl:    atorvastatin (LIPITOR) 80 MG tablet, Take 1 tablet (80 mg total) by mouth daily at 8 pm., Disp: 90 tablet, Rfl: 3   fluticasone (FLONASE) 50 MCG/ACT nasal spray, Place 2 sprays into both nostrils daily as needed for allergies., Disp: , Rfl:    irbesartan (AVAPRO) 150 MG tablet, Take 1 tablet (150 mg total) by mouth daily., Disp: 30 tablet, Rfl: 6   Meclizine HCl 25 MG CHEW, Chew 25 mg by mouth every 6 (six) hours as needed for dizziness., Disp: , Rfl:    metFORMIN (GLUCOPHAGE-XR) 500 MG 24 hr tablet, Take 1,000 mg by mouth 2 (two) times daily with a meal., Disp: , Rfl:    metoprolol succinate (TOPROL XL) 25 MG 24 hr tablet, Take 1 tablet (25 mg total) by mouth daily., Disp: 90 tablet, Rfl: 3   nitroGLYCERIN (NITROSTAT) 0.4 MG SL tablet, Place 1 tablet (0.4 mg total) under the tongue every 5 (five) minutes as needed., Disp: 25 tablet, Rfl: 12   ONETOUCH VERIO test strip, USE 1 STRIP TO CHECK GLUCOSE TWICE DAILY, Disp: , Rfl:    sertraline (ZOLOFT) 50 MG tablet, Take 50 mg by mouth daily., Disp: , Rfl:    STEGLATRO 5 MG TABS, Take 5 mg by mouth daily., Disp: , Rfl:    Study - SOS-AMI - selatogrel 16 mg/0.5 mL or placebo SQ injection (PI-Christopher), Inject 0.5 mLs (16 mg total) into the skin as needed (if experiencing symptoms of a heart attack)., Disp: 0.5 mL, Rfl: 0   ticagrelor (BRILINTA) 90 MG TABS tablet, Take 1 tablet (90 mg total) by mouth 2 (two) times daily., Disp: 60 tablet, Rfl: 11   varenicline (CHANTIX) 1 MG tablet, Take by mouth., Disp: , Rfl:      I discussed with the patient the changes in the HIPPA of the main consent. We will not share the study information until the study has completed. This information will be shared at the end of the study to protect study data and integrity.

## 2021-10-06 ENCOUNTER — Other Ambulatory Visit: Payer: Self-pay | Admitting: Physician Assistant

## 2021-11-01 ENCOUNTER — Encounter: Payer: Self-pay | Admitting: *Deleted

## 2021-11-01 DIAGNOSIS — Z006 Encounter for examination for normal comparison and control in clinical research program: Secondary | ICD-10-CM

## 2021-11-01 NOTE — Research (Signed)
[id number], [facility]     FOLLOW-UP PHONE CALLS  SUBJECT ID:  007 Trainer's name: Luz Lex Trainer's signature: on Delegation of Authority Log Date of the Follow up call:  19Sept 2023           Visit Number: 007 Start time of the Follow up call:  10:30               End time of call: 10:43   - CONTACT   Q1;  Was the follow up phone call done with the subject?  '[x]'$   YES  '[]'$   NO           If NO, check the following:      Q2:    Was the follow up phone call done with a family member               Or caregiver?    '[]'$   YES   '[x]'$   NO          Make note about reasons ___________________________________    AUTOINJECTOR LABEL:  Still legible?  '[x]'$   Yes  '[]'$   No  - MEDICAL CONDITION      Q3:  Did any of the following occur?   '[]'$   Death       '[]'$   Hospitalization (any cause)    No  events     '[]'$   Use of autoinjector  Make note about the type of event, and when it occurred. In case of hospitalization: the Location of the hospital and/or the treating physician's contact details  ____________ ____________________________________________________________________ ____________________________________________________________________  Note: remember to report any SAE/AE which has occurred within 30 days after any  injection of the study drug on relevant eCRF forms.   Q4:  Did the subject develop any condition which is an          exclusion criterion?  '[]'$   YES  '[x]'$   NO   Take note about the occurrence of any exclusion criterion after randomization: _________________________________________________________________ __________________________________________________________________  Q5: Was there any change in subject's antithrombotic therapy?   '[]'$   YES  '[x]'$   NO     Take not about any change of antithrombotic treatment:  _______________________ ____________________________________________________________________ ____________________________________________________________________      Lezlie Octave OF THE STUDY-SPECFIC TRAINING  Q6: Did the subject correctly reply to the following questions?       A.  What are common heart attack symptoms?      '[x]'$   YES   '[]'$   NO      B.  What has to be done in case any of those symptoms occurs? '[x]'$   YES  '[]'$   NO      C.  What are the main steps to perform a self-injection?  '[x]'$   YES  '[]'$   NO             If NO, then report which step/s was/were missing:        '[]'$   Choose injection site (abdomen or thigh)       '[]'$   Twist cap off       '[]'$   Pinch skin and place the study autoinjector       '[]'$   Firmly push down and hold for 3 seconds       D. What has to be done immediately after an injection?  '[x]'$   YES   '[]'$   NO          If NO, then report which step/s was/were missing:       '[]'$   Call  for emergency medical help       '[]'$   Show the autoinjector to the emergency medical responder       E.  Does the subject recall where s/he keeps/ stores the autoinjectors? '[x]'$  YES  '[]'$  NO          Note the place of storage and any corrective explanation if needed below __________________________________________________________________ __________________________________________________________________  - TRAINING REFRESHER   Q7:  Is a training refresher needed?      '[]'$  YES  '[x]'$  NO        If YES, indicate items that have to be refreshed. More than one may apply:               '[]'$   Heart attack symptoms             '[]'$   Actions to be taken following heart attack symptoms             '[]'$   Steps to perform the self-injection and follow-up actions to be taken   '[]'$   Other, Specify  _________________________________________      Current Outpatient Medications:    aspirin EC 81 MG tablet, Take 81 mg by mouth daily., Disp: , Rfl:    atorvastatin (LIPITOR) 80 MG tablet, TAKE 1 TABLET BY MOUTH  ONCE DAILY AT  8  PM, Disp: 30 tablet, Rfl: 0   BRILINTA 90 MG TABS tablet, Take 1 tablet by mouth twice daily, Disp: 60 tablet, Rfl: 0   fluticasone (FLONASE) 50 MCG/ACT nasal spray, Place 2 sprays into both nostrils daily as needed for allergies., Disp: , Rfl:    irbesartan (AVAPRO) 150 MG tablet, Take 1 tablet (150 mg total) by mouth daily., Disp: 30 tablet, Rfl: 6   Meclizine HCl 25 MG CHEW, Chew 25 mg by mouth every 6 (six) hours as needed for dizziness., Disp: , Rfl:    metFORMIN (GLUCOPHAGE-XR) 500 MG 24 hr tablet, Take 1,000 mg by mouth 2 (two) times daily with a meal., Disp: , Rfl:    metoprolol succinate (TOPROL XL) 25 MG 24 hr tablet, Take 1 tablet (25 mg total) by mouth daily., Disp: 90 tablet, Rfl: 3   nitroGLYCERIN (NITROSTAT) 0.4 MG SL tablet, Place 1 tablet (0.4 mg total) under the tongue every 5 (five) minutes as needed., Disp: 25 tablet, Rfl: 12   ONETOUCH VERIO test strip, USE 1 STRIP TO CHECK GLUCOSE TWICE DAILY, Disp: , Rfl:    sertraline (ZOLOFT) 50 MG tablet, Take 50 mg by mouth daily., Disp: , Rfl:    STEGLATRO 5 MG TABS, Take 5 mg by mouth daily., Disp: , Rfl:    Study - [facility]OS-[facility]I - selatogrel 16 mg/0.5 mL or placebo SQ injection (PI-Christopher), Inject 0.5 mLs (16 mg total) into the skin as needed (if experiencing symptoms of a heart attack)., Disp: 0.5 mL, Rfl: 0   varenicline (CHANTIX) 1 MG tablet, Take by mouth., Disp: , Rfl:

## 2021-11-03 ENCOUNTER — Other Ambulatory Visit: Payer: Self-pay | Admitting: Physician Assistant

## 2021-11-26 ENCOUNTER — Other Ambulatory Visit: Payer: Self-pay | Admitting: Physician Assistant

## 2021-11-26 NOTE — Telephone Encounter (Signed)
This is Dr. Berry's pt 

## 2021-12-17 ENCOUNTER — Telehealth: Payer: Self-pay | Admitting: *Deleted

## 2021-12-17 DIAGNOSIS — Z006 Encounter for examination for normal comparison and control in clinical research program: Secondary | ICD-10-CM

## 2021-12-17 NOTE — Telephone Encounter (Signed)
I left message for patient to call me for SOS-AMI study for visit 8 starting Nov.28,2023. We need to switch out study medications.

## 2021-12-23 ENCOUNTER — Other Ambulatory Visit: Payer: Self-pay | Admitting: Physician Assistant

## 2021-12-23 NOTE — Telephone Encounter (Signed)
This is Dr. Berry's pt 

## 2021-12-29 ENCOUNTER — Other Ambulatory Visit: Payer: Self-pay | Admitting: Physician Assistant

## 2021-12-30 NOTE — Telephone Encounter (Signed)
This is Dr. Berry's pt 

## 2022-01-07 ENCOUNTER — Telehealth: Payer: Self-pay | Admitting: *Deleted

## 2022-01-07 DIAGNOSIS — Z006 Encounter for examination for normal comparison and control in clinical research program: Secondary | ICD-10-CM

## 2022-01-07 NOTE — Telephone Encounter (Signed)
I called patient to set up appointment for SOS AMI study. I left message for him to call me. We need to switch old  study medication for new study. medication.

## 2022-01-20 ENCOUNTER — Other Ambulatory Visit: Payer: Self-pay | Admitting: Physician Assistant

## 2022-02-05 DIAGNOSIS — Z006 Encounter for examination for normal comparison and control in clinical research program: Secondary | ICD-10-CM

## 2022-02-05 MED ORDER — STUDY - SOS-AMI - SELATOGREL 16 MG/0.5 ML OR PLACEBO SQ INJECTION (PI-CHRISTOPHER): 16.0000 mg | INJECTION | SUBCUTANEOUS | 0 refills | Status: DC | PRN

## 2022-02-05 NOTE — Research (Addendum)
[id number], AC[facility]-YSA     FOLLOW-UP  SUBJECT ID:  [id number] Trainer's name: Rubie Maid, RCIS Trainer's signature: on Delegation of Authority Log Date of the Follow up:        05-Feb-2022  Visit Number: 8 Study Information Study (Protocol)  [phone]  Site Code  [phone]  Site Name  Tillie Rung. Metzger d/b/a Perdido, MD  Subject Information Subject ID [id number] Age 66 Gender Male Visit Name  Kit Replacement  Kit Replacement Date  05-Feb-2022  Product Assigned  [id number], [id number]   - CONTACT   Q1;  Was the follow up phone done with the subject?  _0   YES  _1   NO           If NO, check the following:      Q2:    Was the follow up done with a family member               Or caregiver?    _2   YES   _3   NO          Make note about reasons ___________________________________    AUTOINJECTOR LABEL:  Still legible?  _4   Yes  _5   No  - MEDICAL CONDITION      Q3:  Did any of the following occur?   _6   Death       _7   Hospitalization (any cause)         _8   Use of autoinjector  Make note about the type of event, and when it occurred. In case of hospitalization: the Location of the hospital and/or the treating physician's contact details  ____________ ____________________________________________________________________ ____________________________________________________________________  Note: remember to report any SAE/AE which has occurred within 30 days after any  injection of the study drug on relevant eCRF forms.   Q4:  Did the subject develop any condition which is an          exclusion criterion?  _9   YES  _10   NO   Take note about the occurrence of any exclusion criterion after randomization: _________________________________________________________________ __________________________________________________________________  Q5: Was there any change in subject's  antithrombotic therapy?   _11   YES  _12   NO     Take not about any change of antithrombotic treatment:  Brilinta decreased to 90 MG QD in November by his Primary MD.        - RECOLLECTION OF THE STUDY-SPECFIC TRAINING  Q6: Did the subject correctly reply to the following questions?       A.  What are common heart attack symptoms?      _13   YES   _14   NO      B.  What has to be done in case any of those symptoms occurs? _15   YES  _16   NO      C.  What are the main steps to perform a self-injection?  _17   YES  _18   NO             If NO, then report which step/s was/were missing:        _19   Choose injection site (abdomen or thigh)       _20   Twist cap off       _21   Pinch skin and place the study autoinjector       _22   Firmly push down and hold for 3 seconds       D. What has to be done immediately after  an injection?  _0   YES   _1   NO          If NO, then report which step/s was/were missing:       _2   Call  for emergency medical help       _3   Show the autoinjector to the emergency medical responder       E.  Does the subject recall where s/he keeps/ stores the autoinjectors? _4  YES  _5  NO          Note the place of storage and any corrective explanation if needed below __________________________________________________________________ __________________________________________________________________  - TRAINING REFRESHER   Q7:  Is a training refresher needed?      _6  YES  _7  NO        If YES, indicate items that have to be refreshed. More than one may apply:               _8   Heart attack symptoms             _9   Actions to be taken following heart attack symptoms             _10   Steps to perform the self-injection and follow-up actions to be taken   _11   Other, Specify  _________________________________________     Form Based on [facility]_CRF_Version 5.0_27Oct2021 [id number], 62,            and eCRF  Version 5.0-  28-Dec-2019

## 2022-02-05 NOTE — Research (Signed)
Visit 8 No events noted or hospitalizations.  Current Outpatient Medications:    aspirin EC 81 MG tablet, Take 81 mg by mouth daily., Disp: , Rfl:    atorvastatin (LIPITOR) 80 MG tablet, TAKE 1 TABLET BY MOUTH ONCE DAILY . APPOINTMENT REQUIRED FOR FUTURE REFILLS 1ST  ATTEMPT, Disp: 30 tablet, Rfl: 0   fluticasone (FLONASE) 50 MCG/ACT nasal spray, Place 2 sprays into both nostrils daily as needed for allergies., Disp: , Rfl:    irbesartan (AVAPRO) 150 MG tablet, Take 1 tablet (150 mg total) by mouth daily., Disp: 30 tablet, Rfl: 6   Meclizine HCl 25 MG CHEW, Chew 25 mg by mouth every 6 (six) hours as needed for dizziness., Disp: , Rfl:    metFORMIN (GLUCOPHAGE-XR) 500 MG 24 hr tablet, Take 1,000 mg by mouth 2 (two) times daily with a meal., Disp: , Rfl:    metoprolol succinate (TOPROL XL) 25 MG 24 hr tablet, Take 1 tablet (25 mg total) by mouth daily., Disp: 90 tablet, Rfl: 3   nitroGLYCERIN (NITROSTAT) 0.4 MG SL tablet, Place 1 tablet (0.4 mg total) under the tongue every 5 (five) minutes as needed., Disp: 25 tablet, Rfl: 12   ONETOUCH VERIO test strip, USE 1 STRIP TO CHECK GLUCOSE TWICE DAILY, Disp: , Rfl:    sertraline (ZOLOFT) 50 MG tablet, Take 50 mg by mouth daily., Disp: , Rfl:    STEGLATRO 5 MG TABS, Take 5 mg by mouth daily., Disp: , Rfl:    Study - [facility] - selatogrel 16 mg/0.5 mL or placebo SQ injection (PI-Christopher), Inject 0.5 mLs (16 mg total) into the skin as needed (if experiencing symptoms of a heart attack). For Investigational Use Only. Inject 0.5 mL subcutaneously in the abdomen or thigh if you experience symptoms of a heart attack. Call 911 immediately and seek emergency medical support. [facility] Research for any questions or concerns regarding this medication., Disp: 1 mL, Rfl: 0   ticagrelor (BRILINTA) 90 MG TABS tablet, Take 1 tablet (90 mg total) by mouth 2 (two) times daily. Patient must schedule appointment for future refills final attempt, Disp: 30  tablet, Rfl: 0   varenicline (CHANTIX) 1 MG tablet, Take by mouth., Disp: , Rfl:

## 2022-02-06 DIAGNOSIS — Z006 Encounter for examination for normal comparison and control in clinical research program: Secondary | ICD-10-CM

## 2022-02-06 NOTE — Research (Signed)
  Patient came in for his [place] visit yesterday and stated that his Primary MD had changed his Brilinta to 90 MG QD. I instructed him to speak with Cardiology about any changes with this medication. I spoke with Dr. Gwenlyn Found and then advised the patient to resume taking Brilinita 90 MG BID as originally prescribed until told differently by Cardiology.  I also advised the patient to schedule a follow-up with Cardiology at this time.

## 2022-03-04 ENCOUNTER — Other Ambulatory Visit: Payer: Self-pay

## 2022-03-04 MED ORDER — ATORVASTATIN CALCIUM 80 MG PO TABS: ORAL_TABLET | ORAL | 0 refills | Status: DC

## 2022-03-04 NOTE — Telephone Encounter (Signed)
This is Dr. Berry's pt 

## 2022-04-04 ENCOUNTER — Other Ambulatory Visit (HOSPITAL_COMMUNITY): Payer: Self-pay

## 2022-04-06 ENCOUNTER — Other Ambulatory Visit: Payer: Self-pay | Admitting: Cardiovascular Disease

## 2022-04-10 NOTE — Progress Notes (Signed)
Cardiology Clinic Note   Patient Name: Joseph Wilkerson Date of Encounter: 04/11/2022  Primary Care Provider:  Prince Solian, MD Primary Cardiologist:  Quay Burow, MD  Patient Profile    Joseph Wilkerson 67 year old male presents to the clinic today for follow-up evaluation of his coronary artery disease and hyperlipidemia.  Past Medical History    Past Medical History:  Diagnosis Date   Allergic rhinitis    Allergy    Arthritis    Depression    Diabetes (HCC)    GERD (gastroesophageal reflux disease)    Hemorrhoids    HTN (hypertension)    Hyperlipidemia    Nephrolithiasis    OSA (obstructive sleep apnea)    Sleep apnea    Past Surgical History:  Procedure Laterality Date   COLONOSCOPY     CORONARY STENT INTERVENTION N/A 10/11/2020   Procedure: CORONARY STENT INTERVENTION;  Surgeon: Lorretta Harp, MD;  Location: [facility];  Service: Cardiovascular;  Laterality: N/A;   CORONARY/GRAFT ACUTE MI REVASCULARIZATION N/A 10/10/2020   Procedure: Coronary/Graft Acute MI Revascularization;  Surgeon: Lorretta Harp, MD;  Location: [place] [facility];  Service: Cardiovascular;  Laterality: N/A;   KYPHOPLASTY  06/18/2012   sec L Comp Fx   LEFT HEART CATH AND CORONARY ANGIOGRAPHY N/A 10/10/2020   Procedure: LEFT HEART CATH AND CORONARY ANGIOGRAPHY;  Surgeon: Lorretta Harp, MD;  Location: [facility];  Service: Cardiovascular;  Laterality: N/A;    Allergies  Allergies  Allergen Reactions   Ticagrelor Shortness Of Breath    History of Present Illness    Joseph Wilkerson has a PMH of STEMI, NSVT, acute diastolic CHF, HTN, sleep apnea, tobacco abuse diabetes.  He was admitted 8/22 with inferior STEMI.  He noted sudden chest tightness while he had been watching TV.  He reported pain had radiated to his left arm.  He had associated dyspnea, clamminess, sweating, and lightheadedness.  He underwent emergent cardiac catheterization on 8/22.  He was  noted to have an occluded proximal RCA.  He received PCI with DES to the lesion and he was also noted to have 80% circumflex stenosis.  He underwent staged PCI and the next day received DES to that lesion as well.  His echocardiogram showed an LVEF of 55-60%, G1 DD.  He was noted to have short runs of NSVT post catheterization.  His ectopy resolved with the addition of beta-blocker therapy.  He was noted to have shortness of breath post PCI.  It was felt that it may be related to his Brilinta medication however, his shortness of breath resolved.  He did receive 1 dose of IV furosemide.  He was seen in follow-up by Caron Presume, PA-C 11/02/2020.  During that time he denied further chest pain.  He reported heart healthy diet.  He was trying to increase his physical activity but was limited due to his left knee arthritis.  He plans to quit smoking.  He did note some shortness of breath which was felt to be related to Brilinta.  It had greatly improved however.  He also noted episodes of vertigo which he had been prescribed meclizine for.  He was stable from a cardiac standpoint.  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  He presents to the clinic today for follow-up evaluation states he continues to do well.  We reviewed his cardiac catheterization.  He expressed understanding.  He is  somewhat physically active and continues to work for the [employer].  He is planning to work 1 more year before retiring.  He continues to eat a heart healthy diet and has been currently his meats and eating more vegetables.  He recently had cholesterol labs drawn at his PCP I will request those.  I will give him a salty 6 diet sheet and have him increase his physical activity as tolerated.  His blood pressure initially today is 144/88 on recheck is 136/84.  I will refill his nitroglycerin and plan follow-up in 1 year.  Today he denies chest pain, shortness of breath, lower extremity edema,  fatigue, palpitations, melena, hematuria, hemoptysis, diaphoresis, weakness, presyncope, syncope, orthopnea, and PND.    Home Medications    Prior to Admission medications   Medication Sig Start Date End Date Taking? Authorizing Provider  aspirin EC 81 MG tablet Take 81 mg by mouth daily.    [provider]  atorvastatin (LIPITOR) 80 MG tablet TAKE 1 TABLET BY MOUTH ONCE DAILY . APPOINTMENT REQUIRED FOR FUTURE REFILLS 2ND ATTEMPT 04/08/22   Lorretta Harp, MD  fluticasone [facility]) 50 MCG/ACT nasal spray Place 2 sprays into both nostrils daily as needed for allergies. 05/30/18   [provider]  irbesartan (AVAPRO) 150 MG tablet Take 1 tablet (150 mg total) by mouth daily. 10/12/20   Leanor Kail, PA  Meclizine HCl 25 MG CHEW Chew 25 mg by mouth every 6 (six) hours as needed for dizziness. 07/27/20   [provider]  metFORMIN (GLUCOPHAGE-XR) 500 MG 24 hr tablet Take 1,000 mg by mouth 2 (two) times daily with a meal.    [provider]  metoprolol succinate (TOPROL XL) 25 MG 24 hr tablet Take 1 tablet (25 mg total) by mouth daily. 11/02/20   Warren Lacy, PA-C  nitroGLYCERIN (NITROSTAT) 0.4 MG SL tablet Place 1 tablet (0.4 mg total) under the tongue every 5 (five) minutes as needed. 10/12/20   Bhagat, Crista Luria, PA  ONETOUCH VERIO test strip USE 1 STRIP TO CHECK GLUCOSE TWICE DAILY 10/25/18   [provider]  sertraline (ZOLOFT) 50 MG tablet Take 50 mg by mouth daily. 09/21/20   [provider]  STEGLATRO 5 MG TABS Take 5 mg by mouth daily. 10/21/18   [provider]  Study - SOS-AMI - selatogrel 16 mg/0.5 mL or placebo SQ injection (PI-Christopher) Inject 0.5 mLs (16 mg total) into the skin as needed (if experiencing symptoms of a heart attack). For Investigational Use Only. Inject 0.5 mL subcutaneously in the abdomen or thigh if you experience symptoms of a heart attack. Call 911 immediately and seek emergency medical  support. [facility] for any questions or concerns regarding this medication. 02/05/22   Buford Dresser, MD  ticagrelor (BRILINTA) 90 MG TABS tablet Take 1 tablet (90 mg total) by mouth 2 (two) times daily. Patient must schedule appointment for future refills final attempt 12/30/21   Lorretta Harp, MD  varenicline (CHANTIX) 1 MG tablet Take by mouth. 10/26/20   [provider]    Family History    Family History  Problem Relation Age of Onset   CAD Father    Heart attack Father    Cancer Mother        ? mets   Diabetes Brother    CAD Brother    Colon cancer Neg Hx    Pancreatic cancer Neg Hx    Rectal cancer Neg Hx    Stomach cancer  Neg Hx    Esophageal cancer Neg Hx    He indicated that the status of his mother is unknown. He indicated that the status of his father is unknown. He indicated that the status of his neg hx is unknown.  Social History    Social History   Socioeconomic History   Marital status: Widowed    Spouse name: Not on file   Number of children: 3   Years of education: Not on file   Highest education level: Not on file  Occupational History   Occupation: Hospitality    Employer: [employer]  Tobacco Use   Smoking status: Every Day    Packs/day: 1.00    Types: Cigarettes   Smokeless tobacco: Never  Vaping Use   Vaping Use: Never used  Substance and Sexual Activity   Alcohol use: Yes    Comment: Socially-once per month   Drug use: No   Sexual activity: Not on file  Other Topics Concern   Not on file  Social History Narrative   Not on file   Social Determinants of Health   Financial Resource Strain: Not on file  Food Insecurity: Not on file  Transportation Needs: Not on file  Physical Activity: Not on file  Stress: Not on file  Social Connections: Not on file  Intimate Partner Violence: Not on file     Review of Systems    General:  No chills, fever, night sweats or weight changes.   Cardiovascular:  No chest pain, dyspnea on exertion, edema, orthopnea, palpitations, paroxysmal nocturnal dyspnea. Dermatological: No rash, lesions/masses Respiratory: No cough, dyspnea Urologic: No hematuria, dysuria Abdominal:   No nausea, vomiting, diarrhea, bright red blood per rectum, melena, or hematemesis Neurologic:  No visual changes, wkns, changes in mental status. All other systems reviewed and are otherwise negative except as noted above.  Physical Exam    VS:  BP 136/82   Pulse (!) 58   Ht 5' 11"$  (1.803 m)   Wt 239 lb (108.4 kg)   SpO2 98%   BMI 33.33 kg/m  , BMI Body mass index is 33.33 kg/m. GEN: Well nourished, well developed, in no acute distress. HEENT: normal. Neck: Supple, no JVD, carotid bruits, or masses. Cardiac: RRR, no murmurs, rubs, or gallops. No clubbing, cyanosis, edema.  Radials/DP/PT 2+ and equal bilaterally.  Respiratory:  Respirations regular and unlabored, clear to auscultation bilaterally. GI: Soft, nontender, nondistended, BS + x 4. MS: no deformity or atrophy. Skin: warm and dry, no rash. Neuro:  Strength and sensation are intact. Psych: Normal affect.  Accessory Clinical Findings    Recent Labs: No results found for requested labs within last 365 days.   Recent Lipid Panel    Component Value Date/Time   CHOL 198 10/09/2020 2357   TRIG 222 (H) 10/09/2020 2357   HDL 34 (L) 10/09/2020 2357   CHOLHDL 5.8 10/09/2020 2357   VLDL 44 (H) 10/09/2020 2357   LDLCALC 120 (H) 10/09/2020 2357         ECG personally reviewed by me today-Sinus bradycardia with possible left atrial enlargement left axis deviation incomplete right bundle branch block nonspecific ST and T wave abnormality 58 bpm- No acute changes  Echocardiogram 10/10/2020  IMPRESSIONS     1. Left ventricular ejection fraction, by estimation, is 55 to 60%. The  left ventricle has normal function. The left ventricle has no regional  wall motion abnormalities. There is mild  left ventricular hypertrophy.  Left ventricular diastolic parameters  are  consistent with Grade I diastolic dysfunction (impaired relaxation).   2. Right ventricular systolic function is normal. The right ventricular  size is normal.   3. The mitral valve is grossly normal. No evidence of mitral valve  regurgitation.   4. The aortic valve is tricuspid. Aortic valve regurgitation is trivial.  Mild aortic valve sclerosis is present, with no evidence of aortic valve  stenosis.   5. Aortic dilatation noted. There is borderline dilatation of the aortic  root, measuring 38 mm. There is mild dilatation of the ascending aorta,  measuring 43 mm.   Comparison(s): No prior Echocardiogram.   FINDINGS   Left Ventricle: Left ventricular ejection fraction, by estimation, is 55  to 60%. The left ventricle has normal function. The left ventricle has no  regional wall motion abnormalities. Definity contrast agent was given IV  to delineate the left ventricular   endocardial borders. The left ventricular internal cavity size was normal  in size. There is mild left ventricular hypertrophy. Left ventricular  diastolic parameters are consistent with Grade I diastolic dysfunction  (impaired relaxation). Indeterminate  filling pressures.   Right Ventricle: The right ventricular size is normal. No increase in  right ventricular wall thickness. Right ventricular systolic function is  normal.   Left Atrium: Left atrial size was normal in size.   Right Atrium: Right atrial size was normal in size.   Pericardium: There is no evidence of pericardial effusion.   Mitral Valve: The mitral valve is grossly normal. No evidence of mitral  valve regurgitation.   Tricuspid Valve: The tricuspid valve is not well visualized. Tricuspid  valve regurgitation is not demonstrated.   Aortic Valve: The aortic valve is tricuspid. Aortic valve regurgitation is  trivial. Aortic regurgitation PHT measures 444 msec. Mild  aortic valve  sclerosis is present, with no evidence of aortic valve stenosis.   Pulmonic Valve: The pulmonic valve was not well visualized. Pulmonic valve  regurgitation is not visualized.   Aorta: Aortic dilatation noted. There is borderline dilatation of the  aortic root, measuring 38 mm. There is mild dilatation of the ascending  aorta, measuring 43 mm.   Venous: The inferior vena cava was not well visualized.   IAS/Shunts: No atrial level shunt detected by color flow Doppler.          Prox Cx to Mid Cx lesion is 90% stenosed.   Non-stenotic Ost RCA to Prox RCA lesion was previously treated.   A drug-eluting stent was successfully placed using a STENT ONYX FRONTIER 3.0X15.   Post intervention, there is a 0% residual stenosis.   Joseph Wilkerson is a 67 y.o. male      [id number] LOCATION:  FACILITY: [facility]  PHYSICIAN: Quay Burow, M.D. [birth year]   Diagnostic Dominance: Right  Intervention      Assessment & Plan   1.  Coronary artery disease-no chest pain today.  Denies recent episodes of chest discomfort.  Underwent staged PCI 8/22 with PCI and DES to his RCA and circumflex. Continue aspirin, atorvastatin, metoprolol May stop Brilinta Heart healthy low-sodium diet-salty 6 given Increase physical activity as tolerated Refill nitroglycerin  Hyperlipidemia-LDL 67 on 02/07/21 Continue aspirin, atorvastatin Heart healthy low-sodium high-fiber diet Increase physical activity as tolerated  Essential hypertension-BP today Continue irbesartan, metoprolol Heart healthy low-sodium diet-salty 6 given Increase physical activity as tolerated  OSA-reports compliance with CPAP.  Waking up well rested. Continue CPAP use.  Disposition: Follow-up with Dr.Berry or me in 12 months.   Jossie Ng. Rhen Dossantos NP-C  04/11/2022, 2:09 PM [place] [address] Office [phone] Fax [phone]    I spent 14 minutes examining  this patient, reviewing medications, and using patient centered shared decision making involving her cardiac care.  Prior to her visit I spent greater than 20 minutes reviewing her past medical history,  medications, and prior cardiac tests.

## 2022-04-11 ENCOUNTER — Ambulatory Visit: Payer: 59 | Attending: General Practice | Admitting: General Practice

## 2022-04-11 ENCOUNTER — Encounter: Payer: Self-pay | Admitting: General Practice

## 2022-04-11 VITALS — BP 136/82 | HR 58 | Ht 71.0 in | Wt 239.0 lb

## 2022-04-11 DIAGNOSIS — E785 Hyperlipidemia, unspecified: Secondary | ICD-10-CM

## 2022-04-11 DIAGNOSIS — I2510 Atherosclerotic heart disease of native coronary artery without angina pectoris: Secondary | ICD-10-CM | POA: Diagnosis not present

## 2022-04-11 DIAGNOSIS — I10 Essential (primary) hypertension: Secondary | ICD-10-CM

## 2022-04-11 DIAGNOSIS — G4733 Obstructive sleep apnea (adult) (pediatric): Secondary | ICD-10-CM | POA: Diagnosis not present

## 2022-04-11 MED ORDER — NITROGLYCERIN 0.4 MG SL SUBL: 0.4000 mg | SUBLINGUAL_TABLET | SUBLINGUAL | 3 refills | Status: AC | PRN

## 2022-04-11 NOTE — Patient Instructions (Signed)
Medication Instructions:  Your physician recommends that you continue on your current medications as directed. Please refer to the Current Medication list given to you today.  *If you need a refill on your cardiac medications before your next appointment, please call your pharmacy*  Please increase physical activity as tolerated    Lab Work: NONE If you have labs (blood work) drawn today and your tests are completely normal, you will receive your results only by: [place] (if you have MyChart) OR A paper copy in the mail If you have any lab test that is abnormal or we need to change your treatment, we will call you to review the results.   Testing/Procedures: NONE   Follow-Up: At [facility], you and your health needs are our priority.  As part of our continuing mission to provide you with exceptional heart care, we have created designated Provider Care Teams.  These Care Teams include your primary Cardiologist (physician) and Advanced Practice Providers (APPs -  Physician Assistants and Nurse Practitioners) who all work together to provide you with the care you need, when you need it.  We recommend signing up for the patient portal called "MyChart".  Sign up information is provided on this After Visit Summary.  MyChart is used to connect with patients for Virtual Visits (Telemedicine).  Patients are able to view lab/test results, encounter notes, upcoming appointments, etc.  Non-urgent messages can be sent to your provider as well.   To learn more about what you can do with MyChart, go to [email or web address].    Your next appointment:   1 year(s)  Provider:   Quay Burow, MD     Other Instructions

## 2022-04-21 ENCOUNTER — Other Ambulatory Visit: Payer: Self-pay | Admitting: Cardiovascular Disease

## 2022-04-29 ENCOUNTER — Telehealth: Payer: Self-pay | Admitting: *Deleted

## 2022-04-29 DIAGNOSIS — Z006 Encounter for examination for normal comparison and control in clinical research program: Secondary | ICD-10-CM

## 2022-04-29 NOTE — Telephone Encounter (Signed)
I called patient for SOS-AMI study visit 9. I left message for patient to call me back.

## 2022-05-01 ENCOUNTER — Telehealth: Payer: Self-pay | Admitting: *Deleted

## 2022-05-01 DIAGNOSIS — Z006 Encounter for examination for normal comparison and control in clinical research program: Secondary | ICD-10-CM

## 2022-05-01 NOTE — Telephone Encounter (Signed)
I called patient for SOS-AMI study visit 9. I left message for patient to call me back so we can do visit over phone.

## 2022-05-02 ENCOUNTER — Encounter: Payer: Self-pay | Admitting: *Deleted

## 2022-05-02 DIAGNOSIS — Z006 Encounter for examination for normal comparison and control in clinical research program: Secondary | ICD-10-CM

## 2022-05-02 NOTE — Research (Signed)
[id number], SOS-AMI     FOLLOW-UP PHONE CALLS  SUBJECT ID:   Trainer's name: Patton Salles  Trainer's signature: on Delegation of Authority Log yes Date of the Follow up call:     05/02/22         Visit Number:  Start time of the Follow up call:   1200              End time of call: 1214   - CONTACT   Q1;  Was the follow up phone call done with the subject?  '[x]'$   YES  '[]'$   NO           If NO, check the following:      Q2:    Was the follow up phone call done with a family member               Or caregiver?    '[]'$   YES   '[x]'$   NO          Make note about reasons ___________________________________    AUTOINJECTOR LABEL:  Still legible?  '[x]'$   Yes  '[]'$   No  - MEDICAL CONDITION      Q3:  Did any of the following occur?   '[]'$   Death       '[]'$   Hospitalization (any cause)         '[]'$   Use of autoinjector      '[x]'$  N/A Make note about the type of event, and when it occurred. In case of hospitalization: the Location of the hospital and/or the treating physician's contact details  ____________ ____________________________________________________________________ ____________________________________________________________________  Note: remember to report any SAE/AE which has occurred within 30 days after any  injection of the study drug on relevant eCRF forms.   Q4:  Did the subject develop any condition which is an          exclusion criterion?  '[]'$   YES  '[x]'$   NO   Take note about the occurrence of any exclusion criterion after randomization: _________________________________________________________________ __________________________________________________________________  Q5: Was there any change in subject's antithrombotic therapy?   '[x]'$   YES  '[]'$   NO     Take not about any change of antithrombotic treatment:  _______________________ ____________________________________________________________________ ____________________________________________________________________      Joseph Wilkerson OF THE STUDY-SPECFIC TRAINING  Q6: Did the subject correctly reply to the following questions?       A.  What are common heart attack symptoms?      '[x]'$   YES   '[]'$   NO      B.  What has to be done in case any of those symptoms occurs? '[x]'$   YES  '[]'$   NO      C.  What are the main steps to perform a self-injection?  '[x]'$   YES  '[]'$   NO             If NO, then report which step/s was/were missing:        '[]'$   Choose injection site (abdomen or thigh)       '[]'$   Twist cap off       '[]'$   Pinch skin and place the study autoinjector       '[]'$   Firmly push down and hold for 3 seconds       D. What has to be done immediately after an injection?  '[x]'$   YES   '[]'$   NO          If NO, then report  which step/s was/were missing:       '[]'$   Call  for emergency medical help       '[]'$   Show the autoinjector to the emergency medical responder       E.  Does the subject recall where s/he keeps/ stores the autoinjectors? '[x]'$  YES  '[]'$  NO          Note the place of storage and any corrective explanation if needed below __________________________________________________________________ __________________________________________________________________  - TRAINING REFRESHER   Q7:  Is a training refresher needed?      '[]'$  YES  '[x]'$  NO        If YES, indicate items that have to be refreshed. More than one may apply:               '[]'$   Heart attack symptoms             '[]'$   Actions to be taken following heart attack symptoms             '[]'$   Steps to perform the self-injection and follow-up actions to be taken   '[]'$   Other, Specify  _________________________________________           Current Outpatient Medications:    atorvastatin (LIPITOR) 80 MG tablet, TAKE 1 TABLET BY MOUTH ONCE DAILY . APPOINTMENT REQUIRED FOR FUTURE REFILLS 2ND   ATTEMPT, Disp: 15 tablet, Rfl: 0   clopidogrel (PLAVIX) 75 MG tablet, Take 75 mg by mouth daily., Disp: , Rfl:    fluticasone (FLONASE) 50 MCG/ACT nasal spray, Place 2 sprays into both nostrils daily as needed for allergies., Disp: , Rfl:    irbesartan (AVAPRO) 300 MG tablet, Take 300 mg by mouth daily., Disp: , Rfl:    Meclizine HCl 25 MG CHEW, Chew 25 mg by mouth every 6 (six) hours as needed for dizziness., Disp: , Rfl:    metFORMIN (GLUCOPHAGE-XR) 500 MG 24 hr tablet, Take 1,000 mg by mouth 2 (two) times daily with a meal., Disp: , Rfl:    metoprolol succinate (TOPROL XL) 25 MG 24 hr tablet, Take 1 tablet (25 mg total) by mouth daily., Disp: 90 tablet, Rfl: 3   nitroGLYCERIN (NITROSTAT) 0.4 MG SL tablet, Place 1 tablet (0.4 mg total) under the tongue every 5 (five) minutes as needed., Disp: 25 tablet, Rfl: 3   ONETOUCH VERIO test strip, USE 1 STRIP TO CHECK GLUCOSE TWICE DAILY, Disp: , Rfl:    sertraline (ZOLOFT) 100 MG tablet, Take 100 mg by mouth daily., Disp: , Rfl:    STEGLATRO 5 MG TABS, Take 5 mg by mouth daily., Disp: , Rfl:    Study - SOS-AMI - selatogrel 16 mg/0.5 mL or placebo SQ injection (PI-Christopher), Inject 0.5 mLs (16 mg total) into the skin as needed (if experiencing symptoms of a heart attack). For Investigational Use Only. Inject 0.5 mL subcutaneously in the abdomen or thigh if you experience symptoms of a heart attack. Call 911 immediately and seek emergency medical support. [facility] for any questions or concerns regarding this medication., Disp: 1 mL, Rfl: 0   aspirin EC 81 MG tablet, Take 81 mg by mouth daily. (Patient not taking: Reported on 05/02/2022), Disp: , Rfl:    ticagrelor (BRILINTA) 90 MG TABS tablet, Take 1 tablet (90 mg total) by mouth 2 (two) times daily. Patient must schedule appointment for future refills final attempt (Patient not taking: Reported on 05/02/2022), Disp: 30 tablet, Rfl: 0

## 2022-06-01 ENCOUNTER — Other Ambulatory Visit: Payer: Self-pay | Admitting: Cardiovascular Disease

## 2022-06-15 ENCOUNTER — Other Ambulatory Visit: Payer: Self-pay | Admitting: General Practice

## 2022-07-31 ENCOUNTER — Telehealth: Payer: Self-pay | Admitting: *Deleted

## 2022-07-31 DIAGNOSIS — Z006 Encounter for examination for normal comparison and control in clinical research program: Secondary | ICD-10-CM

## 2022-07-31 NOTE — Telephone Encounter (Signed)
I called patient for SOS-AMi study for Visit 10. I left message for patient to call me back.

## 2022-08-05 DIAGNOSIS — Z006 Encounter for examination for normal comparison and control in clinical research program: Secondary | ICD-10-CM

## 2022-08-05 NOTE — Research (Signed)
Left message for patient to call back for SOS-AMi study Visit 10.

## 2022-08-05 NOTE — Research (Addendum)
[id number], [facility]     FOLLOW-UP PHONE CALLS  SUBJECT ID:  [id number] Trainer's name: Dutch Quint Trainer's signature: on Delegation of [employer] Log Date of the Follow up call:   05-Aug-2022  Visit Number: 10 Start time of the Follow up call:    1140      End time of call: 1150   - CONTACT   Q1;  Was the follow up phone call done with the subject?  [x]   YES  []   NO           If NO, check the following:      Q2:    Was the follow up phone call done with a family member               Or caregiver?    []   YES   [x]   NO          Make note about reasons ___________________________________    AUTOINJECTOR LABEL:  Still legible?  [x]   Yes  []   No  - MEDICAL CONDITION      Q3:  Did any of the following occur?   []   Death       []   Hospitalization (any cause)         []   Use of autoinjector  Make note about the type of event, and when it occurred. In case of hospitalization: the Location of the hospital and/or the treating physician's contact details  ____________ ____________________________________________________________________ ____________________________________________________________________  Note: remember to report any SAE/AE which has occurred within 30 days after any  injection of the study drug on relevant eCRF forms.   Q4:  Did the subject develop any condition which is an          exclusion criterion?  []   YES  [x]   NO   Take note about the occurrence of any exclusion criterion after randomization: _________________________________________________________________ __________________________________________________________________  Q5: Was there any change in subject's antithrombotic therapy?   []   YES  [x]   NO     Take not about any change of antithrombotic treatment: _______________________ ____________________________________________________________________ ____________________________________________________________________      Tedra [place] OF THE STUDY-SPECFIC TRAINING  Q6: Did the subject correctly reply to the following questions?       A.  What are common heart attack symptoms?      [x]   YES   []   NO      B.  What has to be done in case any of those symptoms occurs? [x]   YES  []   NO      C.  What are the main steps to perform a self-injection?  [x]   YES  []   NO             If NO, then report which step/s was/were missing:        []   Choose injection site (abdomen or thigh)       []   Twist cap off       []   Pinch skin and place the study autoinjector       []   Firmly push down and hold for 3 seconds       D. What has to be done immediately after an injection?  [x]   YES   []   NO          If NO, then report which step/s was/were missing:       []   Call  for emergency medical help       []   Show the autoinjector to the emergency medical responder       E.  Does the subject recall where s/he keeps/ stores the autoinjectors? [x]  YES  []  NO          Note the place of storage and any corrective explanation if needed below __________________________________________________________________ __________________________________________________________________  - TRAINING REFRESHER   Q7:  Is a training refresher needed?      []  YES  [x]  NO        If YES, indicate items that have to be refreshed. More than one may apply:               []   Heart attack symptoms             []   Actions to be taken following heart attack symptoms             []   Steps to perform the self-injection and follow-up actions to be taken   []   Other, Specify  _________________________________________         Current Outpatient Medications:    atorvastatin (LIPITOR) 80 MG tablet, TAKE 1 TABLET BY MOUTH ONCE DAILY, Disp: 90 tablet, Rfl: 3   clopidogrel (PLAVIX) 75 MG tablet, Take 75 mg by mouth daily., Disp: , Rfl:    fluticasone (FLONASE) 50 MCG/ACT nasal spray, Place 2 sprays into both nostrils daily as needed for allergies., Disp: ,  Rfl:    irbesartan (AVAPRO) 300 MG tablet, Take 300 mg by mouth daily., Disp: , Rfl:    Meclizine HCl 25 MG CHEW, Chew 25 mg by mouth every 6 (six) hours as needed for dizziness., Disp: , Rfl:    metFORMIN (GLUCOPHAGE-XR) 500 MG 24 hr tablet, Take 1,000 mg by mouth 2 (two) times daily with a meal., Disp: , Rfl:    metoprolol succinate (TOPROL XL) 25 MG 24 hr tablet, Take 1 tablet (25 mg total) by mouth daily., Disp: 90 tablet, Rfl: 3   nitroGLYCERIN (NITROSTAT) 0.4 MG SL tablet, Place 1 tablet (0.4 mg total) under the tongue every 5 (five) minutes as needed., Disp: 25 tablet, Rfl: 3   ONETOUCH VERIO test strip, USE 1 STRIP TO CHECK GLUCOSE TWICE DAILY, Disp: , Rfl:    sertraline (ZOLOFT) 100 MG tablet, Take 100 mg by mouth daily., Disp: , Rfl:    STEGLATRO 5 MG TABS, Take 5 mg by mouth daily., Disp: , Rfl:    Study - [facility] - selatogrel 16 mg/0.5 mL or placebo SQ injection ([facility]), Inject 0.5 mLs (16 mg total) into the skin as needed (if experiencing symptoms of a heart attack). For Investigational Use Only. Inject 0.5 mL subcutaneously in the abdomen or thigh if you experience symptoms of a heart attack. Call 911 immediately and seek emergency medical support. Contact [facility] for any questions or concerns regarding this medication., Disp: 1 mL, Rfl: 0   aspirin EC 81 MG tablet, Take 81 mg by mouth daily. (Patient not taking: Reported on 05/02/2022), Disp: , Rfl:    ticagrelor (BRILINTA) 90 MG TABS tablet, Take 1 tablet (90 mg total) by mouth 2 (two) times daily. Patient must schedule appointment for future refills final attempt (Patient not taking: Reported on 05/02/2022), Disp: 30 tablet, Rfl: 0

## 2022-10-29 MED ORDER — STUDY - SOS-AMI - SELATOGREL 16 MG/0.5 ML OR PLACEBO SQ INJECTION (PI-CHRISTOPHER): 16.0000 mg | INJECTION | SUBCUTANEOUS | 0 refills | Status: DC | PRN

## 2022-10-29 NOTE — Research (Addendum)
[id number], [facility]     FOLLOW-UP PHONE CALLS  SUBJECT ID:  [id number] Trainer's name: Marquelle Musgrave Trainer's signature: on De[employer]ation [employer] Log Date of the Follow up call:    29-Oct-2022  Visit Number: 47 Start time of the Follow up:   1505              End time: 1530   Kits returned: [phone] [id number]  Kits received: [phone]  Q1;  Was the follow up done with the subject?  [x]   YES  []   NO           If NO, check the following:      Q2:    Was the follow up done with a family member               Or caregiver?    []   YES   [x]   NO          Make note about reasons ___________________________________    AUTOINJECTOR LABEL:  Still legible?  [x]   Yes  []   No  - MEDICAL CONDITION      Q3:  Did any of the following occur?   []   Death       []   Hospitalization (any cause)         []   Use of autoinjector  Make note about the type of event, and when it occurred. In case of hospitalization: the Location of the hospital and/or the treating physician's contact details  ____________ ____________________________________________________________________ ____________________________________________________________________  Note: remember to report any SAE/AE which has occurred within 30 days after any  injection of the study drug on relevant eCRF forms.   Q4:  Did the subject develop any condition which is an          exclusion criterion?  []   YES  [x]   NO   Take note about the occurrence of any exclusion criterion after randomization: _________________________________________________________________ __________________________________________________________________  Q5: Was there any change in subject's antithrombotic therapy?   []   YES  [x]   NO     Take not about any change of antithrombotic treatment:  _______________________ ____________________________________________________________________ ____________________________________________________________________      Tedra [place] OF THE STUDY-SPECFIC TRAINING  Q6: Did the subject correctly reply to the following questions?       A.  What are common heart attack symptoms?      [x]   YES   []   NO      B.  What has to be done in case any of those symptoms occurs? [x]   YES  []   NO      C.  What are the main steps to perform a self-injection?  [x]   YES  []   NO             If NO, then report which step/s was/were missing:        []   Choose injection site (abdomen or thigh)       []   Twist cap off       []   Pinch skin and place the study autoinjector       []   Firmly push down and hold for 3 seconds       D. What has to be done immediately after an injection?  [x]   YES   []   NO          If NO, then report which step/s was/were missing:       []   Call  for emergency medical help       []   Show the autoinjector to the emergency medical responder       E.  Does the subject recall where s/he keeps/ stores the autoinjectors? [x]  YES  []  NO          Note the place of storage and any corrective explanation if needed below __________________________________________________________________ __________________________________________________________________  - TRAINING REFRESHER   Q7:  Is a training refresher needed?      []  YES  [x]  NO        If YES, indicate items that have to be refreshed. More than one may apply:               []   Heart attack symptoms             []   Actions to be taken following heart attack symptoms             []   Steps to perform the self-injection and follow-up actions to be taken   []   Other, Specify  _________________________________________

## 2023-02-02 DIAGNOSIS — Z006 Encounter for examination for normal comparison and control in clinical research program: Secondary | ICD-10-CM

## 2023-02-02 NOTE — Research (Signed)
[id number], [facility]     FOLLOW-UP PHONE CALLS  SUBJECT ID:  [id number] Trainer's name: Dutch Quint Trainer's signature: on Delegation of [employer] Log Date of the Follow up call:   02-Feb-2023  Visit Number: 12   Start time of the Follow up call:   1100             End time of call: 1110   Q1;  Was the follow up phone call done with the subject?  [x]   YES  []   NO           If NO, check the following:      Q2:    Was the follow up phone call done with a family member               Or caregiver?    []   YES   [x]   NO          Make note about reasons ___________________________________    AUTOINJECTOR LABEL:  Still legible?  [x]   Yes  []   No  - MEDICAL CONDITION      Q3:  Did any of the following occur?   []   Death       []   Hospitalization (any cause)         []   Use of autoinjector  Make note about the type of event, and when it occurred. In case of hospitalization: the Location of the hospital and/or the treating physician's contact details  ____________ ____________________________________________________________________ ____________________________________________________________________  Note: remember to report any SAE/AE which has occurred within 30 days after any  injection of the study drug on relevant eCRF forms.   Q4:  Did the subject develop any condition which is an          exclusion criterion?  []   YES  [x]   NO    Q5: Was there any change in subject's antithrombotic therapy?   []   YES  [x]   NO      - RECOLLECTION OF THE STUDY-SPECFIC TRAINING  Q6: Did the subject correctly reply to the following questions?       A.  What are common heart attack symptoms?      [x]   YES   []   NO      B.  What has to be done in case any of those symptoms occurs? [x]   YES  []   NO      C.  What are the main steps to perform a self-injection?  [x]   YES  []   NO             If NO, then report which step/s was/were missing:        []   Choose injection site  (abdomen or thigh)       []   Twist cap off       []   Pinch skin and place the study autoinjector       []   Firmly push down and hold for 3 seconds       D. What has to be done immediately after an injection?  [x]   YES   []   NO          If NO, then report which step/s was/were missing:       []   Call  for emergency medical help       []   Show the autoinjector to the emergency medical responder       E.  Does the subject recall where s/he keeps/  stores the autoinjectors? [x]  YES  []  NO          Note the place of storage and any corrective explanation if needed below __________________________________________________________________ __________________________________________________________________  - TRAINING REFRESHER   Q7:  Is a training refresher needed?      []  YES  [x]  NO        If YES, indicate items that have to be refreshed. More than one may apply:               []   Heart attack symptoms             []   Actions to be taken following heart attack symptoms             []   Steps to perform the self-injection and follow-up actions to be taken   []   Other, Specify  _________________________________________       Current Outpatient Medications:    aspirin EC 81 MG tablet, Take 81 mg by mouth daily. (Patient not taking: Reported on 05/02/2022), Disp: , Rfl:    atorvastatin (LIPITOR) 80 MG tablet, TAKE 1 TABLET BY MOUTH ONCE DAILY, Disp: 90 tablet, Rfl: 3   clopidogrel (PLAVIX) 75 MG tablet, Take 75 mg by mouth daily., Disp: , Rfl:    fluticasone (FLONASE) 50 MCG/ACT nasal spray, Place 2 sprays into both nostrils daily as needed for allergies., Disp: , Rfl:    irbesartan (AVAPRO) 300 MG tablet, Take 300 mg by mouth daily., Disp: , Rfl:    Meclizine HCl 25 MG CHEW, Chew 25 mg by mouth every 6 (six) hours as needed for dizziness., Disp: , Rfl:    metFORMIN (GLUCOPHAGE-XR) 500 MG 24 hr tablet, Take 1,000 mg by mouth 2 (two) times daily with a meal., Disp: , Rfl:    metoprolol succinate  (TOPROL XL) 25 MG 24 hr tablet, Take 1 tablet (25 mg total) by mouth daily., Disp: 90 tablet, Rfl: 3   nitroGLYCERIN (NITROSTAT) 0.4 MG SL tablet, Place 1 tablet (0.4 mg total) under the tongue every 5 (five) minutes as needed., Disp: 25 tablet, Rfl: 3   ONETOUCH VERIO test strip, USE 1 STRIP TO CHECK GLUCOSE TWICE DAILY, Disp: , Rfl:    sertraline (ZOLOFT) 100 MG tablet, Take 100 mg by mouth daily. (Patient not taking: Reported on 10/29/2022), Disp: , Rfl:    STEGLATRO 5 MG TABS, Take 5 mg by mouth daily. (Patient not taking: Reported on 10/29/2022), Disp: , Rfl:    Study - [facility] - selatogrel 16 mg/0.5 mL or placebo SQ injection ([facility]), Inject 0.5 mLs (16 mg total) into the skin as needed (if experiencing symptoms of a heart attack). For Investigational Use Only. Inject 0.5 mL subcutaneously in the abdomen or thigh if you experience symptoms of a heart attack. Call 911 immediately and seek emergency medical support. Contact [facility] for any questions or concerns regarding this medication., Disp: 1 mL, Rfl: 0   ticagrelor (BRILINTA) 90 MG TABS tablet, Take 1 tablet (90 mg total) by mouth 2 (two) times daily. Patient must schedule appointment for future refills final attempt (Patient not taking: Reported on 05/02/2022), Disp: 30 tablet, Rfl: 0

## 2023-05-06 DIAGNOSIS — Z006 Encounter for examination for normal comparison and control in clinical research program: Secondary | ICD-10-CM

## 2023-05-06 NOTE — Research (Signed)
 [id number], [facility]     FOLLOW-UP PHONE CALLS  SUBJECT ID:  [id number] Trainer's name: Dutch Quint Trainer's signature: on Delegation [employer] Log Date of the Follow up call:  06-May-2023  Visit Number: 60 Start time of the Follow up call:    1005             End time of call: 1015    Q1;  Was the follow up phone call done with the subject?  [x]   YES  []   NO           If NO, check the following:      Q2:    Was the follow up phone call done with a family member               Or caregiver?    []   YES   [x]   NO          Make note about reasons ___________________________________    AUTOINJECTOR LABEL:  Still legible?  [x]   Yes  []   No  - MEDICAL CONDITION      Q3:  Did any of the following occur?   []   Death       []   Hospitalization (any cause)         []   Use of autoinjector  Make note about the type of event, and when it occurred. In case of hospitalization: the Location of the hospital and/or the treating physician's contact details  ____________ ____________________________________________________________________ ____________________________________________________________________    Q4:  Did the subject develop any condition which is an          exclusion criterion?  []   YES  [x]   NO   Take note about the occurrence of any exclusion criterion after randomization: _________________________________________________________________ __________________________________________________________________  Q5: Was there any change in subject's antithrombotic therapy?   []   YES  [x]   NO     Take note about any change of antithrombotic treatment: _______________________ ____________________________________________________________________ ____________________________________________________________________      Tedra [place] OF THE STUDY-SPECFIC TRAINING  Q6: Did the subject correctly reply to the following questions?       A.  What are common heart  attack symptoms?      [x]   YES   []   NO      B.  What has to be done in case any of those symptoms occurs? [x]   YES  []   NO      C.  What are the main steps to perform a self-injection?  [x]   YES  []   NO             If NO, then report which step/s was/were missing:        []   Choose injection site (abdomen or thigh)       []   Twist cap off       []   Pinch skin and place the study autoinjector       []   Firmly push down and hold for 3 seconds       D. What has to be done immediately after an injection?  [x]   YES   []   NO          If NO, then report which step/s was/were missing:       []   Call  for emergency medical help       []   Show the autoinjector to the emergency medical responder       E.  Does the subject recall  where s/he keeps/ stores the autoinjectors? [x]  YES  []  NO          Note the place of storage and any corrective explanation if needed below __________________________________________________________________ __________________________________________________________________  - TRAINING REFRESHER   Q7:  Is a training refresher needed?      []  YES  [x]  NO        If YES, indicate items that have to be refreshed. More than one may apply:               []   Heart attack symptoms             []   Actions to be taken following heart attack symptoms             []   Steps to perform the self-injection and follow-up actions to be taken   []   Other, Specify  _________________________________________        Current Outpatient Medications:    aspirin EC 81 MG tablet, Take 81 mg by mouth daily. (Patient not taking: Reported on 05/02/2022), Disp: , Rfl:    atorvastatin (LIPITOR) 80 MG tablet, TAKE 1 TABLET BY MOUTH ONCE DAILY, Disp: 90 tablet, Rfl: 3   clopidogrel (PLAVIX) 75 MG tablet, Take 75 mg by mouth daily., Disp: , Rfl:    fluticasone (FLONASE) 50 MCG/ACT nasal spray, Place 2 sprays into both nostrils daily as needed for allergies., Disp: , Rfl:    irbesartan (AVAPRO) 300 MG  tablet, Take 300 mg by mouth daily., Disp: , Rfl:    Meclizine HCl 25 MG CHEW, Chew 25 mg by mouth every 6 (six) hours as needed for dizziness., Disp: , Rfl:    metFORMIN (GLUCOPHAGE-XR) 500 MG 24 hr tablet, Take 1,000 mg by mouth 2 (two) times daily with a meal., Disp: , Rfl:    metoprolol succinate (TOPROL XL) 25 MG 24 hr tablet, Take 1 tablet (25 mg total) by mouth daily., Disp: 90 tablet, Rfl: 3   nitroGLYCERIN (NITROSTAT) 0.4 MG SL tablet, Place 1 tablet (0.4 mg total) under the tongue every 5 (five) minutes as needed., Disp: 25 tablet, Rfl: 3   ONETOUCH VERIO test strip, USE 1 STRIP TO CHECK GLUCOSE TWICE DAILY, Disp: , Rfl:    sertraline (ZOLOFT) 100 MG tablet, Take 100 mg by mouth daily. (Patient not taking: Reported on 10/29/2022), Disp: , Rfl:    STEGLATRO 5 MG TABS, Take 5 mg by mouth daily. (Patient not taking: Reported on 10/29/2022), Disp: , Rfl:    Study - [facility] - selatogrel 16 mg/0.5 mL or placebo SQ injection (PI-Christ[facility]), Inject 0.5 mLs (16 mg total) into the skin as needed (if experiencing symptoms of a heart attack). For Investigational Use Only. Inject 0.5 mL subcutaneously in the abdomen or thigh if you experience symptoms of a heart attack. Call 911 immediately and seek emergency medical support. Contact [facility] for any questions or concerns regarding this medication., Disp: 1 mL, Rfl: 0   ticagrelor (BRILINTA) 90 MG TABS tablet, Take 1 tablet (90 mg total) by mouth 2 (two) times daily. Patient must schedule appointment for future refills final attempt (Patient not taking: Reported on 05/02/2022), Disp: 30 tablet, Rfl: 0

## 2023-06-06 ENCOUNTER — Other Ambulatory Visit: Payer: Self-pay | Admitting: General Practice

## 2023-06-29 ENCOUNTER — Telehealth: Payer: Self-pay | Admitting: *Deleted

## 2023-06-29 NOTE — Telephone Encounter (Signed)
 I called patient to remind him that we need to set up appointment for [facility]to exchange his auto injectors. The auto-infectors expire 07/01/23. Patient called us  back and scheduled appointment for 07/01/23 at 3 pm.

## 2023-07-01 ENCOUNTER — Encounter

## 2023-07-01 DIAGNOSIS — Z006 Encounter for examination for normal comparison and control in clinical research program: Secondary | ICD-10-CM

## 2023-07-01 MED ORDER — STUDY - SOS-AMI - SELATOGREL 16 MG/0.5 ML OR PLACEBO SQ INJECTION (PI-CHRISTOPHER): 16.0000 mg | INJECTION | SUBCUTANEOUS | 0 refills | Status: DC | PRN

## 2023-07-01 NOTE — Research (Signed)
 [facility]       SUBJECT ID:  [id number] Trainer's name: Hamp Levine Trainer's signature: on Delegation of Authority Log Date of the Follow up:   01-Jul-2023    Kits returned: [phone]    Kits received: [phone]

## 2023-07-07 ENCOUNTER — Other Ambulatory Visit: Payer: Self-pay | Admitting: Cardiovascular Disease

## 2023-08-05 ENCOUNTER — Other Ambulatory Visit: Payer: Self-pay | Admitting: General Practice

## 2023-08-13 DIAGNOSIS — Z006 Encounter for examination for normal comparison and control in clinical research program: Secondary | ICD-10-CM

## 2023-08-13 NOTE — Research (Signed)
 [facility]     FOLLOW-UP PHONE CALLS  SUBJECT ID:  [id number] Trainer's name: Hamp Levine Trainer's signature: on Delegation of Authority Log Date of the Follow up call:    13-Aug-2023  Visit Number: 48 Start time of the Follow up call:   0945              End time of call: 0955    Q1;  Was the follow up phone call done with the subject?  [x]   YES  []   NO           If NO, check the following:      Q2:    Was the follow up phone call done with a family member               Or caregiver?    []   YES   [x]   NO          Make note about reasons ___________________________________    AUTOINJECTOR LABEL:  Still legible?  [x]   Yes  []   No     Q3:  Did any of the following occur?   []   Death       []   Hospitalization (any cause)         []   Use of autoinjector  Make note about the type of event, and when it occurred. In case of hospitalization: the Location of the hospital and/or the treating physician's contact details  ____________ ____________________________________________________________________ ____________________________________________________________________    Q4:  Did the subject develop any condition which is an          exclusion criterion?  []   YES  [x]   NO   Take note about the occurrence of any exclusion criterion after randomization: _________________________________________________________________ __________________________________________________________________  Q5: Was there any change in subject's antithrombotic therapy?   []   YES  [x]   NO     Take note about any change of antithrombotic treatment: _______________________ ____________________________________________________________________ ____________________________________________________________________      Veneta Gilding OF THE STUDY-SPECFIC TRAINING  Q6: Did the subject correctly reply to the following questions?       A.  What are common heart attack symptoms?      [x]    YES   []   NO      B.  What has to be done in case any of those symptoms occurs? [x]   YES  []   NO      C.  What are the main steps to perform a self-injection?  [x]   YES  []   NO             If NO, then report which step/s was/were missing:        []   Choose injection site (abdomen or thigh)       []   Twist cap off       []   Pinch skin and place the study autoinjector       []   Firmly push down and hold for 3 seconds       D. What has to be done immediately after an injection?  [x]   YES   []   NO          If NO, then report which step/s was/were missing:       []   Call  for emergency medical help       []   Show the autoinjector to the emergency medical responder       E.  Does the subject recall where s/he keeps/ stores the  autoinjectors? [x]  YES  []  NO          Note the place of storage and any corrective explanation if needed below __________________________________________________________________ __________________________________________________________________  TRAINING REFRESHER   Q7:  Is a training refresher needed?      []  YES  [x]  NO        If YES, indicate items that have to be refreshed. More than one may apply:               []   Heart attack symptoms             []   Actions to be taken following heart attack symptoms             []   Steps to perform the self-injection and follow-up actions to be taken   []   Other, Specify  _________________________________________          Current Outpatient Medications:    aspirin  EC 81 MG tablet, Take 81 mg by mouth daily. (Patient not taking: Reported on 05/02/2022), Disp: , Rfl:    atorvastatin  (LIPITOR ) 80 MG tablet, Take 1 tablet by mouth once daily, Disp: 15 tablet, Rfl: 0   clopidogrel (PLAVIX) 75 MG tablet, Take 75 mg by mouth daily., Disp: , Rfl:    fluticasone (FLONASE) 50 MCG/ACT nasal spray, Place 2 sprays into both nostrils daily as needed for allergies., Disp: , Rfl:    irbesartan  (AVAPRO ) 300 MG tablet, Take 300 mg by  mouth daily., Disp: , Rfl:    Meclizine HCl 25 MG CHEW, Chew 25 mg by mouth every 6 (six) hours as needed for dizziness., Disp: , Rfl:    metFORMIN (GLUCOPHAGE-XR) 500 MG 24 hr tablet, Take 1,000 mg by mouth 2 (two) times daily with a meal., Disp: , Rfl:    metoprolol  succinate (TOPROL  XL) 25 MG 24 hr tablet, Take 1 tablet (25 mg total) by mouth daily., Disp: 90 tablet, Rfl: 3   nitroGLYCERIN  (NITROSTAT ) 0.4 MG SL tablet, Place 1 tablet (0.4 mg total) under the tongue every 5 (five) minutes as needed., Disp: 25 tablet, Rfl: 3   ONETOUCH VERIO test strip, USE 1 STRIP TO CHECK GLUCOSE TWICE DAILY, Disp: , Rfl:    sertraline (ZOLOFT) 100 MG tablet, Take 100 mg by mouth daily. (Patient not taking: Reported on 10/29/2022), Disp: , Rfl:    STEGLATRO 5 MG TABS, Take 5 mg by mouth daily. (Patient not taking: Reported on 10/29/2022), Disp: , Rfl:    Study - [facility] - selatogrel 16 mg/0.5 mL or placebo SQ injection ([facility]), Inject 0.5 mLs (16 mg total) into the skin as needed (if experiencing symptoms of a heart attack). For Investigational Use Only. Inject 0.5 mL subcutaneously in the abdomen or thigh if you experience symptoms of a heart attack. Call 911 immediately and seek emergency medical support. Contact [facility] for any questions or concerns regarding this medication., Disp: 1 mL, Rfl: 0   ticagrelor  (BRILINTA ) 90 MG TABS tablet, Take 1 tablet (90 mg total) by mouth 2 (two) times daily. Patient must schedule appointment for future refills final attempt (Patient not taking: Reported on 05/02/2022), Disp: 30 tablet, Rfl: 0

## 2023-11-10 DIAGNOSIS — Z006 Encounter for examination for normal comparison and control in clinical research program: Secondary | ICD-10-CM

## 2023-11-10 NOTE — Research (Signed)
 [facility]     FOLLOW-UP PHONE CALLS  SUBJECT ID:  [id number] Trainer's name: Greig Lay Trainer's signature: on Delegation of Authority Log Date of the Follow up call:    10-Nov-2023  Visit Number: 15 Start time of the Follow up call:     1000          End time of call: 1010    Q1;  Was the follow up phone call done with the subject?  [x]   YES  []   NO           If NO, check the following:      Q2:    Was the follow up phone call done with a family member               Or caregiver?    []   YES   [x]   NO          Make note about reasons ___________________________________    AUTOINJECTOR LABEL:  Still legible?  [x]   Yes  []   No     Q3:  Did any of the following occur?   []   Death       []   Hospitalization (any cause)         []   Use of autoinjector  Make note about the type of event, and when it occurred. In case of hospitalization: the Location of the hospital and/or the treating physician's contact details  ____________ ____________________________________________________________________ ____________________________________________________________________    Q4:  Did the subject develop any condition which is an          exclusion criterion?  []   YES  [x]   NO   Take note about the occurrence of any exclusion criterion after randomization: _________________________________________________________________ __________________________________________________________________  Q5: Was there any change in subject's antithrombotic therapy?   []   YES  [x]   NO     Take note about any change of antithrombotic treatment: _______________________ ____________________________________________________________________ ____________________________________________________________________      SHARYLE OF THE STUDY-SPECFIC TRAINING  Q6: Did the subject correctly reply to the following questions?       A.  What are common heart attack symptoms?      [x]   YES    []   NO      B.  What has to be done in case any of those symptoms occurs? [x]   YES  []   NO      C.  What are the main steps to perform a self-injection?  [x]   YES  []   NO             If NO, then report which step/s was/were missing:        []   Choose injection site (abdomen or thigh)       []   Twist cap off       []   Pinch skin and place the study autoinjector       []   Firmly push down and hold for 3 seconds       D. What has to be done immediately after an injection?  [x]   YES   []   NO          If NO, then report which step/s was/were missing:       []   Call  for emergency medical help       []   Show the autoinjector to the emergency medical responder       E.  Does the subject recall where s/he keeps/ stores the autoinjectors? [  x] YES  []  NO          Note the place of storage and any corrective explanation if needed below __________________________________________________________________ __________________________________________________________________  TRAINING REFRESHER   Q7:  Is a training refresher needed?      []  YES  [x]  NO        If YES, indicate items that have to be refreshed. More than one may apply:               []   Heart attack symptoms             []   Actions to be taken following heart attack symptoms             []   Steps to perform the self-injection and follow-up actions to be taken   []   Other, Specify  _________________________________________      Current Outpatient Medications:    aspirin  EC 81 MG tablet, Take 81 mg by mouth daily. (Patient not taking: Reported on 05/02/2022), Disp: , Rfl:    atorvastatin  (LIPITOR ) 80 MG tablet, Take 1 tablet by mouth once daily, Disp: 15 tablet, Rfl: 0   clopidogrel (PLAVIX) 75 MG tablet, Take 75 mg by mouth daily., Disp: , Rfl:    fluticasone (FLONASE) 50 MCG/ACT nasal spray, Place 2 sprays into both nostrils daily as needed for allergies., Disp: , Rfl:    irbesartan  (AVAPRO ) 300 MG tablet, Take 300 mg by mouth  daily., Disp: , Rfl:    Meclizine HCl 25 MG CHEW, Chew 25 mg by mouth every 6 (six) hours as needed for dizziness., Disp: , Rfl:    metFORMIN (GLUCOPHAGE-XR) 500 MG 24 hr tablet, Take 1,000 mg by mouth 2 (two) times daily with a meal., Disp: , Rfl:    metoprolol  succinate (TOPROL  XL) 25 MG 24 hr tablet, Take 1 tablet (25 mg total) by mouth daily., Disp: 90 tablet, Rfl: 3   nitroGLYCERIN  (NITROSTAT ) 0.4 MG SL tablet, Place 1 tablet (0.4 mg total) under the tongue every 5 (five) minutes as needed., Disp: 25 tablet, Rfl: 3   ONETOUCH VERIO test strip, USE 1 STRIP TO CHECK GLUCOSE TWICE DAILY, Disp: , Rfl:    sertraline (ZOLOFT) 100 MG tablet, Take 100 mg by mouth daily. (Patient not taking: Reported on 10/29/2022), Disp: , Rfl:    STEGLATRO 5 MG TABS, Take 5 mg by mouth daily. (Patient not taking: Reported on 10/29/2022), Disp: , Rfl:    Study - [facility] - selatogrel 16 mg/0.5 mL or placebo SQ injection ([facility]), Inject 0.5 mLs (16 mg total) into the skin as needed (if experiencing symptoms of a heart attack). For Investigational Use Only. Inject 0.5 mL subcutaneously in the abdomen or thigh if you experience symptoms of a heart attack. Call 911 immediately and seek emergency medical support. Contact [facility] for any questions or concerns regarding this medication., Disp: 1 mL, Rfl: 0   ticagrelor  (BRILINTA ) 90 MG TABS tablet, Take 1 tablet (90 mg total) by mouth 2 (two) times daily. Patient must schedule appointment for future refills final attempt (Patient not taking: Reported on 05/02/2022), Disp: 30 tablet, Rfl: 0

## 2024-02-02 DIAGNOSIS — Z006 Encounter for examination for normal comparison and control in clinical research program: Secondary | ICD-10-CM

## 2024-02-02 NOTE — Research (Signed)
 [facility]     FOLLOW-UP PHONE CALLS  SUBJECT ID:  [id number] Trainer's name: Teddrick Mallari Trainer's signature: on Delegation of Authority Log Date of the Follow up call:   02-Feb-2024  Visit Number: 16 Start time of the Follow up call:    0930             End time of call: 0940    Q1;  Was the follow up phone call done with the subject?  [x]   YES  []   NO           If NO, check the following:      Q2:    Was the follow up phone call done with a family member               Or caregiver?    []   YES   [x]   NO          Make note about reasons ___________________________________    AUTOINJECTOR LABEL:  Still legible?  [x]   Yes  []   No     Q3:  Did any of the following occur?   []   Death       []   Hospitalization (any cause)         []   Use of autoinjector  Make note about the type of event, and when it occurred. In case of hospitalization: the Location of the hospital and/or the treating physician's contact details  ____________ ____________________________________________________________________ ____________________________________________________________________    Q4:  Did the subject develop any condition which is an          exclusion criterion?  []   YES  [x]   NO   Take note about the occurrence of any exclusion criterion after randomization: _________________________________________________________________ __________________________________________________________________  Q5: Was there any change in subject's antithrombotic therapy?   []   YES  [x]   NO     Take note about any change of antithrombotic treatment: _______________________ ____________________________________________________________________ ____________________________________________________________________      SHARYLE OF THE STUDY-SPECFIC TRAINING  Q6: Did the subject correctly reply to the following questions?       A.  What are common heart attack symptoms?      [x]    YES   []   NO      B.  What has to be done in case any of those symptoms occurs? [x]   YES  []   NO      C.  What are the main steps to perform a self-injection?  [x]   YES  []   NO             If NO, then report which step/s was/were missing:        []   Choose injection site (abdomen or thigh)       []   Twist cap off       []   Pinch skin and place the study autoinjector       []   Firmly push down and hold for 3 seconds       D. What has to be done immediately after an injection?  [x]   YES   []   NO          If NO, then report which step/s was/were missing:       []   Call  for emergency medical help       []   Show the autoinjector to the emergency medical responder       E.  Does the subject recall where s/he keeps/ stores the  autoinjectors? [x]  YES  []  NO          Note the place of storage and any corrective explanation if needed below __________________________________________________________________ __________________________________________________________________  TRAINING REFRESHER   Q7:  Is a training refresher needed?      []  YES  [x]  NO        If YES, indicate items that have to be refreshed. More than one may apply:               []   Heart attack symptoms             []   Actions to be taken following heart attack symptoms             []   Steps to perform the self-injection and follow-up actions to be taken   []   Other, Specify  _________________________________________      Current Outpatient Medications:    aspirin  EC 81 MG tablet, Take 81 mg by mouth daily. (Patient not taking: Reported on 05/02/2022), Disp: , Rfl:    atorvastatin  (LIPITOR ) 80 MG tablet, Take 1 tablet by mouth once daily, Disp: 15 tablet, Rfl: 0   clopidogrel (PLAVIX) 75 MG tablet, Take 75 mg by mouth daily., Disp: , Rfl:    fluticasone (FLONASE) 50 MCG/ACT nasal spray, Place 2 sprays into both nostrils daily as needed for allergies., Disp: , Rfl:    irbesartan  (AVAPRO ) 300 MG tablet, Take 300 mg by mouth  daily., Disp: , Rfl:    Meclizine HCl 25 MG CHEW, Chew 25 mg by mouth every 6 (six) hours as needed for dizziness., Disp: , Rfl:    metFORMIN (GLUCOPHAGE-XR) 500 MG 24 hr tablet, Take 1,000 mg by mouth 2 (two) times daily with a meal., Disp: , Rfl:    metoprolol  succinate (TOPROL  XL) 25 MG 24 hr tablet, Take 1 tablet (25 mg total) by mouth daily., Disp: 90 tablet, Rfl: 3   nitroGLYCERIN  (NITROSTAT ) 0.4 MG SL tablet, Place 1 tablet (0.4 mg total) under the tongue every 5 (five) minutes as needed., Disp: 25 tablet, Rfl: 3   ONETOUCH VERIO test strip, USE 1 STRIP TO CHECK GLUCOSE TWICE DAILY, Disp: , Rfl:    sertraline (ZOLOFT) 100 MG tablet, Take 100 mg by mouth daily. (Patient not taking: Reported on 10/29/2022), Disp: , Rfl:    STEGLATRO 5 MG TABS, Take 5 mg by mouth daily. (Patient not taking: Reported on 10/29/2022), Disp: , Rfl:    Study - [facility] - selatogrel 16 mg/0.5 mL or placebo SQ injection ([facility]), Inject 0.5 mLs (16 mg total) into the skin as needed (if experiencing symptoms of a heart attack). For Investigational Use Only. Inject 0.5 mL subcutaneously in the abdomen or thigh if you experience symptoms of a heart attack. Call 911 immediately and seek emergency medical support. Contact [facility] for any questions or concerns regarding this medication., Disp: 1 mL, Rfl: 0   ticagrelor  (BRILINTA ) 90 MG TABS tablet, Take 1 tablet (90 mg total) by mouth 2 (two) times daily. Patient must schedule appointment for future refills final attempt (Patient not taking: Reported on 05/02/2022), Disp: 30 tablet, Rfl: 0

## 2024-03-15 ENCOUNTER — Encounter

## 2024-03-15 DIAGNOSIS — Z006 Encounter for examination for normal comparison and control in clinical research program: Secondary | ICD-10-CM

## 2024-03-15 MED ORDER — STUDY - SOS-AMI - SELATOGREL 16 MG/0.5 ML OR PLACEBO SQ INJECTION (PI-CHRISTOPHER): 16.0000 mg | INJECTION | SUBCUTANEOUS | 0 refills | Status: AC | PRN

## 2024-03-15 NOTE — Research (Signed)
"                              [facility]       SUBJECT ID:  [id number] Trainer's name: Rosaline Rancher Trainer's signature: on Delegation of Authority Log Date of the Follow up:   13/Jan/2026    Kits returned: [id number], [id number]    Kits received: [id number], [id number]     Current Medications[1]     [1]  Current Outpatient Medications:    atorvastatin  (LIPITOR ) 80 MG tablet, Take 1 tablet by mouth once daily, Disp: 15 tablet, Rfl: 0   fluticasone (FLONASE) 50 MCG/ACT nasal spray, Place 2 sprays into both nostrils daily as needed for allergies., Disp: , Rfl:    irbesartan  (AVAPRO ) 300 MG tablet, Take 300 mg by mouth daily., Disp: , Rfl:    Meclizine HCl 25 MG CHEW, Chew 25 mg by mouth every 6 (six) hours as needed for dizziness., Disp: , Rfl:    metFORMIN (GLUCOPHAGE-XR) 500 MG 24 hr tablet, Take 1,000 mg by mouth 2 (two) times daily with a meal., Disp: , Rfl:    metoprolol  succinate (TOPROL  XL) 25 MG 24 hr tablet, Take 1 tablet (25 mg total) by mouth daily., Disp: 90 tablet, Rfl: 3   nitroGLYCERIN  (NITROSTAT ) 0.4 MG SL tablet, Place 1 tablet (0.4 mg total) under the tongue every 5 (five) minutes as needed., Disp: 25 tablet, Rfl: 3   ONETOUCH VERIO test strip, USE 1 STRIP TO CHECK GLUCOSE TWICE DAILY, Disp: , Rfl:    Study - [facility] - selatogrel 16 mg/0.5 mL or placebo SQ injection ([facility]), Inject 0.5 mLs (16 mg total) into the skin as needed (if experiencing symptoms of a heart attack). For Investigational Use Only. Inject 0.5 mL subcutaneously in the abdomen or thigh if you experience symptoms of a heart attack. Call 911 immediately and seek emergency medical support. Contact [facility] for any questions or concerns regarding this medication., Disp: 1 mL, Rfl: 0   aspirin  EC 81 MG tablet, Take 81 mg by mouth daily. (Patient not taking: Reported on 03/15/2024), Disp: , Rfl:    clopidogrel (PLAVIX) 75 MG tablet, Take 75 mg by mouth daily. (Patient not taking: Reported on  03/15/2024), Disp: , Rfl:    sertraline (ZOLOFT) 100 MG tablet, Take 100 mg by mouth daily. (Patient not taking: Reported on 03/15/2024), Disp: , Rfl:    STEGLATRO 5 MG TABS, Take 5 mg by mouth daily. (Patient not taking: Reported on 03/15/2024), Disp: , Rfl:    ticagrelor  (BRILINTA ) 90 MG TABS tablet, Take 1 tablet (90 mg total) by mouth 2 (two) times daily. Patient must schedule appointment for future refills final attempt (Patient not taking: Reported on 03/15/2024), Disp: 30 tablet, Rfl: 0  "
